# Patient Record
Sex: Female | Born: 1980 | Race: Black or African American | Hispanic: No | Marital: Single | Smoking: Current some day smoker
Health system: Southern US, Community
[De-identification: ages and names within clinical notes are randomized; demographics above are authoritative.]

## PROBLEM LIST (undated history)

## (undated) DIAGNOSIS — F32A Depression, unspecified: Secondary | ICD-10-CM

## (undated) DIAGNOSIS — F1220 Cannabis dependence, uncomplicated: Secondary | ICD-10-CM

## (undated) DIAGNOSIS — F909 Attention-deficit hyperactivity disorder, unspecified type: Secondary | ICD-10-CM

## (undated) DIAGNOSIS — F1321 Sedative, hypnotic or anxiolytic dependence, in remission: Secondary | ICD-10-CM

## (undated) DIAGNOSIS — N189 Chronic kidney disease, unspecified: Secondary | ICD-10-CM

## (undated) DIAGNOSIS — R0602 Shortness of breath: Secondary | ICD-10-CM

## (undated) DIAGNOSIS — E669 Obesity, unspecified: Secondary | ICD-10-CM

## (undated) DIAGNOSIS — E119 Type 2 diabetes mellitus without complications: Secondary | ICD-10-CM

## (undated) DIAGNOSIS — Z813 Family history of other psychoactive substance abuse and dependence: Secondary | ICD-10-CM

## (undated) DIAGNOSIS — F329 Major depressive disorder, single episode, unspecified: Secondary | ICD-10-CM

## (undated) DIAGNOSIS — F419 Anxiety disorder, unspecified: Secondary | ICD-10-CM

## (undated) DIAGNOSIS — A609 Anogenital herpesviral infection, unspecified: Secondary | ICD-10-CM

## (undated) DIAGNOSIS — I10 Essential (primary) hypertension: Secondary | ICD-10-CM

## (undated) DIAGNOSIS — F3181 Bipolar II disorder: Secondary | ICD-10-CM

## (undated) HISTORY — DX: Cannabis dependence, uncomplicated: F12.20

## (undated) HISTORY — DX: Depression, unspecified: F32.A

## (undated) HISTORY — DX: Family history of other psychoactive substance abuse and dependence: Z81.3

## (undated) HISTORY — DX: Major depressive disorder, single episode, unspecified: F32.9

## (undated) HISTORY — DX: Bipolar II disorder: F31.81

## (undated) HISTORY — DX: Attention-deficit hyperactivity disorder, unspecified type: F90.9

## (undated) HISTORY — DX: Anxiety disorder, unspecified: F41.9

## (undated) HISTORY — DX: Anogenital herpesviral infection, unspecified: A60.9

## (undated) HISTORY — DX: Morbid (severe) obesity due to excess calories: E66.01

## (undated) HISTORY — DX: Essential (primary) hypertension: I10

## (undated) HISTORY — DX: Sedative, hypnotic or anxiolytic dependence, in remission: F13.21

---
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
## 2000-03-09 ENCOUNTER — Encounter: Payer: Self-pay | Admitting: Emergency Medicine

## 2000-03-09 ENCOUNTER — Emergency Department (HOSPITAL_COMMUNITY): Admission: EM | Admit: 2000-03-09 | Discharge: 2000-03-09 | Payer: Self-pay | Admitting: Emergency Medicine

## 2000-08-16 ENCOUNTER — Emergency Department (HOSPITAL_COMMUNITY): Admission: EM | Admit: 2000-08-16 | Discharge: 2000-08-16 | Payer: Self-pay | Admitting: Emergency Medicine

## 2000-10-17 ENCOUNTER — Encounter: Payer: Self-pay | Admitting: *Deleted

## 2000-10-17 ENCOUNTER — Emergency Department (HOSPITAL_COMMUNITY): Admission: EM | Admit: 2000-10-17 | Discharge: 2000-10-17 | Payer: Self-pay | Admitting: Emergency Medicine

## 2000-10-30 ENCOUNTER — Emergency Department (HOSPITAL_COMMUNITY): Admission: EM | Admit: 2000-10-30 | Discharge: 2000-10-30 | Payer: Self-pay | Admitting: Emergency Medicine

## 2001-01-26 ENCOUNTER — Emergency Department (HOSPITAL_COMMUNITY): Admission: EM | Admit: 2001-01-26 | Discharge: 2001-01-26 | Payer: Self-pay | Admitting: Emergency Medicine

## 2001-11-15 ENCOUNTER — Inpatient Hospital Stay (HOSPITAL_COMMUNITY): Admission: AD | Admit: 2001-11-15 | Discharge: 2001-11-15 | Payer: Self-pay | Admitting: Obstetrics and Gynecology

## 2004-12-02 ENCOUNTER — Emergency Department (HOSPITAL_COMMUNITY): Admission: EM | Admit: 2004-12-02 | Discharge: 2004-12-02 | Payer: Self-pay | Admitting: Emergency Medicine

## 2005-04-05 ENCOUNTER — Emergency Department (HOSPITAL_COMMUNITY): Admission: EM | Admit: 2005-04-05 | Discharge: 2005-04-05 | Payer: Self-pay | Admitting: *Deleted

## 2005-10-05 ENCOUNTER — Other Ambulatory Visit: Admission: RE | Admit: 2005-10-05 | Discharge: 2005-10-05 | Payer: Self-pay | Admitting: Gynecology

## 2006-03-29 ENCOUNTER — Emergency Department (HOSPITAL_COMMUNITY): Admission: EM | Admit: 2006-03-29 | Discharge: 2006-03-30 | Payer: Self-pay | Admitting: Emergency Medicine

## 2006-04-26 ENCOUNTER — Emergency Department (HOSPITAL_COMMUNITY): Admission: EM | Admit: 2006-04-26 | Discharge: 2006-04-26 | Payer: Self-pay | Admitting: Emergency Medicine

## 2006-08-03 ENCOUNTER — Emergency Department (HOSPITAL_COMMUNITY): Admission: EM | Admit: 2006-08-03 | Discharge: 2006-08-03 | Payer: Self-pay | Admitting: Emergency Medicine

## 2010-04-25 ENCOUNTER — Emergency Department (HOSPITAL_BASED_OUTPATIENT_CLINIC_OR_DEPARTMENT_OTHER)
Admission: EM | Admit: 2010-04-25 | Discharge: 2010-04-25 | Payer: Self-pay | Source: Home / Self Care | Admitting: Emergency Medicine

## 2010-05-02 ENCOUNTER — Emergency Department (HOSPITAL_BASED_OUTPATIENT_CLINIC_OR_DEPARTMENT_OTHER)
Admission: EM | Admit: 2010-05-02 | Discharge: 2010-05-02 | Payer: Self-pay | Source: Home / Self Care | Admitting: Emergency Medicine

## 2010-07-20 LAB — CBC
HCT: 33.3 % — ABNORMAL LOW (ref 36.0–46.0)
Hemoglobin: 11.1 g/dL — ABNORMAL LOW (ref 12.0–15.0)
MCH: 27.5 pg (ref 26.0–34.0)
Platelets: 337 10*3/uL (ref 150–400)
RBC: 4.04 MIL/uL (ref 3.87–5.11)
RDW: 15 % (ref 11.5–15.5)
WBC: 4.1 10*3/uL (ref 4.0–10.5)

## 2010-07-20 LAB — BASIC METABOLIC PANEL
Calcium: 9 mg/dL (ref 8.4–10.5)
Creatinine, Ser: 0.7 mg/dL (ref 0.4–1.2)
GFR calc Af Amer: >60 mL/min (ref >60)
GFR calc non Af Amer: >60 mL/min (ref >60)

## 2010-07-20 LAB — URINALYSIS, ROUTINE W REFLEX MICROSCOPIC
Bilirubin Urine: NEGATIVE
Glucose, UA: NEGATIVE mg/dL
Hgb urine dipstick: NEGATIVE
Ketones, ur: NEGATIVE mg/dL
pH: 7 (ref 5.0–8.0)

## 2010-07-20 LAB — DIFFERENTIAL
Monocytes Absolute: 0.5 10*3/uL (ref 0.1–1.0)
Neutro Abs: 2.3 10*3/uL (ref 1.7–7.7)

## 2010-07-20 LAB — PREGNANCY, URINE: Preg Test, Ur: NEGATIVE

## 2010-08-03 ENCOUNTER — Emergency Department (HOSPITAL_BASED_OUTPATIENT_CLINIC_OR_DEPARTMENT_OTHER)
Admission: EM | Admit: 2010-08-03 | Discharge: 2010-08-03 | Disposition: A | Discharge status: Home / Self Care | Payer: Managed Care, Other (non HMO) | Attending: Emergency Medicine | Admitting: Emergency Medicine

## 2010-08-03 ENCOUNTER — Emergency Department (INDEPENDENT_AMBULATORY_CARE_PROVIDER_SITE_OTHER): Payer: Managed Care, Other (non HMO)

## 2010-08-03 DIAGNOSIS — E119 Type 2 diabetes mellitus without complications: Secondary | ICD-10-CM | POA: Insufficient documentation

## 2010-08-03 DIAGNOSIS — Y93A3 Activity, aerobic and step exercise: Secondary | ICD-10-CM | POA: Insufficient documentation

## 2010-08-03 DIAGNOSIS — X58XXXA Exposure to other specified factors, initial encounter: Secondary | ICD-10-CM | POA: Insufficient documentation

## 2010-08-03 DIAGNOSIS — I10 Essential (primary) hypertension: Secondary | ICD-10-CM | POA: Insufficient documentation

## 2010-08-03 DIAGNOSIS — T148XXA Other injury of unspecified body region, initial encounter: Secondary | ICD-10-CM | POA: Insufficient documentation

## 2010-08-03 DIAGNOSIS — M25559 Pain in unspecified hip: Secondary | ICD-10-CM | POA: Insufficient documentation

## 2010-08-31 ENCOUNTER — Emergency Department (HOSPITAL_BASED_OUTPATIENT_CLINIC_OR_DEPARTMENT_OTHER)
Admission: EM | Admit: 2010-08-31 | Discharge: 2010-08-31 | Disposition: A | Discharge status: Home / Self Care | Payer: Managed Care, Other (non HMO) | Attending: Emergency Medicine | Admitting: Emergency Medicine

## 2010-08-31 ENCOUNTER — Emergency Department (INDEPENDENT_AMBULATORY_CARE_PROVIDER_SITE_OTHER): Payer: Managed Care, Other (non HMO)

## 2010-08-31 DIAGNOSIS — I10 Essential (primary) hypertension: Secondary | ICD-10-CM | POA: Insufficient documentation

## 2010-08-31 DIAGNOSIS — R079 Chest pain, unspecified: Secondary | ICD-10-CM

## 2010-08-31 DIAGNOSIS — R0602 Shortness of breath: Secondary | ICD-10-CM | POA: Insufficient documentation

## 2010-08-31 DIAGNOSIS — R110 Nausea: Secondary | ICD-10-CM

## 2010-08-31 DIAGNOSIS — R0789 Other chest pain: Secondary | ICD-10-CM | POA: Insufficient documentation

## 2010-08-31 DIAGNOSIS — E119 Type 2 diabetes mellitus without complications: Secondary | ICD-10-CM | POA: Insufficient documentation

## 2010-08-31 DIAGNOSIS — J45909 Unspecified asthma, uncomplicated: Secondary | ICD-10-CM | POA: Insufficient documentation

## 2010-08-31 LAB — POCT CARDIAC MARKERS: Troponin i, poc: <0.05 ng/mL (ref 0.00–0.09)

## 2010-08-31 LAB — CBC
HCT: 31.1 % — ABNORMAL LOW (ref 36.0–46.0)
Hemoglobin: 10.5 g/dL — ABNORMAL LOW (ref 12.0–15.0)
MCH: 28 pg (ref 26.0–34.0)
MCHC: 33.8 g/dL (ref 30.0–36.0)
Platelets: 275 10*3/uL (ref 150–400)
RBC: 3.75 MIL/uL — ABNORMAL LOW (ref 3.87–5.11)
WBC: 3.8 10*3/uL — ABNORMAL LOW (ref 4.0–10.5)

## 2010-08-31 LAB — BASIC METABOLIC PANEL
Calcium: 8.5 mg/dL (ref 8.4–10.5)
Sodium: 140 mEq/L (ref 135–145)

## 2010-08-31 LAB — PREGNANCY, URINE: Preg Test, Ur: NEGATIVE

## 2011-08-10 ENCOUNTER — Emergency Department (HOSPITAL_COMMUNITY): Payer: Managed Care, Other (non HMO)

## 2011-08-10 ENCOUNTER — Emergency Department (HOSPITAL_COMMUNITY)
Admission: EM | Admit: 2011-08-10 | Discharge: 2011-08-10 | Disposition: A | Discharge status: Home / Self Care | Payer: Managed Care, Other (non HMO) | Attending: Emergency Medicine | Admitting: Emergency Medicine

## 2011-08-10 ENCOUNTER — Encounter (HOSPITAL_COMMUNITY): Payer: Self-pay | Admitting: Emergency Medicine

## 2011-08-10 DIAGNOSIS — J029 Acute pharyngitis, unspecified: Secondary | ICD-10-CM | POA: Insufficient documentation

## 2011-08-10 DIAGNOSIS — H538 Other visual disturbances: Secondary | ICD-10-CM | POA: Insufficient documentation

## 2011-08-10 DIAGNOSIS — G43909 Migraine, unspecified, not intractable, without status migrainosus: Secondary | ICD-10-CM | POA: Insufficient documentation

## 2011-08-10 HISTORY — DX: Obesity, unspecified: E66.9

## 2011-08-10 LAB — RAPID STREP SCREEN (MED CTR MEBANE ONLY): Streptococcus, Group A Screen (Direct): NEGATIVE

## 2011-08-10 MED ORDER — DEXAMETHASONE SODIUM PHOSPHATE 10 MG/ML IJ SOLN
10.0000 mg | Freq: Once | INTRAMUSCULAR | Status: AC
  Administered 2011-08-10: 10 mg via INTRAMUSCULAR
  Filled 2011-08-10: qty 1

## 2011-08-10 MED ORDER — METOCLOPRAMIDE HCL 5 MG/ML IJ SOLN
10.0000 mg | Freq: Once | INTRAMUSCULAR | Status: AC
  Administered 2011-08-10: 10 mg via INTRAVENOUS
  Filled 2011-08-10: qty 2

## 2011-08-10 MED ORDER — DIPHENHYDRAMINE HCL 50 MG/ML IJ SOLN
25.0000 mg | Freq: Once | INTRAMUSCULAR | Status: AC
  Administered 2011-08-10: 08:00:00 via INTRAVENOUS
  Filled 2011-08-10: qty 1

## 2011-08-10 NOTE — ED Notes (Signed)
PT. REPORTS HEADACHE WITH SORE THROAT ONSET LAST Thursday, DENIES FEVER OR CHILLS , SLIGHT BLURRED VISION .

## 2011-08-10 NOTE — ED Notes (Signed)
Pt transported to and from Ct scanner on stretcher with tech and tolerated well.

## 2011-08-10 NOTE — Discharge Instructions (Signed)
Migraine Headache A migraine headache is an intense, throbbing pain on one or both sides of your head. The exact cause of a migraine headache is not always known. A migraine may be caused when nerves in the brain become irritated and release chemicals that cause swelling within blood vessels, causing pain. Many migraine sufferers have a family history of migraines. Before you get a migraine you may or may not get an aura. An aura is a group of symptoms that can predict the beginning of a migraine. An aura may include:  Visual changes such as:   Flashing lights.   Bright spots or zig-zag lines.   Tunnel vision.   Feelings of numbness.   Trouble talking.   Muscle weakness.  SYMPTOMS  Pain on one or both sides of your head.   Pain that is pulsating or throbbing in nature.   Pain that is severe enough to prevent daily activities.   Pain that is aggravated by any daily physical activity.   Nausea (feeling sick to your stomach), vomiting, or both.   Pain with exposure to bright lights, loud noises, or activity.   General sensitivity to bright lights or loud noises.  MIGRAINE TRIGGERS Examples of triggers of migraine headaches include:   Alcohol.   Smoking.   Stress.   It may be related to menses (female menstruation).   Aged cheeses.   Foods or drinks that contain nitrates, glutamate, aspartame, or tyramine.   Lack of sleep.   Chocolate.   Caffeine.   Hunger.   Medications such as nitroglycerine (used to treat chest pain), birth control pills, estrogen, and some blood pressure medications.  DIAGNOSIS  A migraine headache is often diagnosed based on:  Symptoms.   Physical examination.   A computerized X-ray scan (computed tomography, CT) of your head.  TREATMENT  Medications can help prevent migraines if they are recurrent or should they become recurrent. Your caregiver can help you with a medication or treatment program that will be helpful to you.   Lying  down in a dark, quiet room may be helpful.   Keeping a headache diary may help you find a trend as to what may be triggering your headaches.  SEEK IMMEDIATE MEDICAL CARE IF:   You have confusion, personality changes or seizures.   You have headaches that wake you from sleep.   You have an increased frequency in your headaches.   You have a stiff neck.   You have a loss of vision.   You have muscle weakness.   You start losing your balance or have trouble walking.   You feel faint or pass out.  MAKE SURE YOU:   Understand these instructions.   Will watch your condition.   Will get help right away if you are not doing well or get worse.  Document Released: 04/26/2005 Document Revised: 04/15/2011 Document Reviewed: 12/10/2008 [facility] Patient Information 2012 [place], [place].

## 2011-08-10 NOTE — ED Notes (Signed)
Pt presents with 2-3 day h/o headache, chills and sore throat.  Pt seen at OSH on Saturday, was + for strep, received IV fluids, "2 shots" and discharged.  Pt reports pain has continued, reports now she is having neck pain and vomiting.

## 2011-08-10 NOTE — ED Provider Notes (Signed)
History     CSN: [id number]  Arrival date & time 08/10/11  0532   First MD Initiated Contact with Patient 08/10/11 0725      Chief Complaint  Patient presents with  . Headache    (Consider location/radiation/quality/duration/timing/severity/associated sxs/prior treatment) Patient is a 31 y.o. female presenting with headaches. The history is provided by the patient.  Headache    patient presents with headache x4 days. Location of the headache is in the front portion of her face radiates down to once for neck. She has used Aleve without relief. Some photophobia but no fever or vomiting. Denies any meningismus. Some blurred vision but no visual loss. Patient also notes sore throat was seen in the hospital recently for same and had a positive strep test but was not treated with antibiotics  Past Medical History  Diagnosis Date  . Obesity     History reviewed. No pertinent past surgical history.  No family history on file.  History  Substance Use Topics  . Smoking status: Never Smoker   . Smokeless tobacco: Not on file  . Alcohol Use: No    OB History    Grav Para Term Preterm Abortions TAB SAB Ect Mult Living                  Review of Systems  Neurological: Positive for headaches.  All other systems reviewed and are negative.    Allergies  Penicillins  Home Medications  No current outpatient prescriptions on file.  BP 152/108  Pulse 74  Temp(Src) 99.4 F (37.4 C) (Oral)  Resp 19  SpO2 99%  LMP 08/05/2011  Physical Exam  Nursing note and vitals reviewed. Constitutional: She is oriented to person, place, and time. She appears well-developed and well-nourished.  Non-toxic appearance. No distress.  HENT:  Head: Normocephalic and atraumatic.  Eyes: Conjunctivae, EOM and lids are normal. Pupils are equal, round, and reactive to light.  Neck: Normal range of motion. Neck supple. No tracheal deviation present. No mass present.  Cardiovascular: Normal rate,  regular rhythm and normal heart sounds.  Exam reveals no gallop.   No murmur heard. Pulmonary/Chest: Effort normal and breath sounds normal. No stridor. No respiratory distress. She has no decreased breath sounds. She has no wheezes. She has no rhonchi. She has no rales.  Abdominal: Soft. Normal appearance and bowel sounds are normal. She exhibits no distension. There is no tenderness. There is no rebound and no CVA tenderness.  Musculoskeletal: Normal range of motion. She exhibits no edema and no tenderness.  Neurological: She is alert and oriented to person, place, and time. She has normal strength. No cranial nerve deficit or sensory deficit. GCS eye subscore is 4. GCS verbal subscore is 5. GCS motor subscore is 6.  Skin: Skin is warm and dry. No abrasion and no rash noted.  Psychiatric: She has a normal mood and affect. Her speech is normal and behavior is normal.    ED Course  Procedures (including critical care time)  Labs Reviewed - No data to display No results found.   No diagnosis found.    MDM  Pt given meds for her headache and feels better, head ct neg--no concern for [facility] or meningitis--repeat neuro exam at time of d/c stable, no nuchal regidity--will d/c        Toy Baker, MD 08/10/11 [phone]

## 2011-10-06 ENCOUNTER — Ambulatory Visit: Payer: Managed Care, Other (non HMO) | Admitting: *Deleted

## 2011-12-03 ENCOUNTER — Encounter (HOSPITAL_COMMUNITY): Payer: Self-pay | Admitting: *Deleted

## 2011-12-03 ENCOUNTER — Encounter (HOSPITAL_COMMUNITY): Payer: Self-pay | Admitting: Psychiatry

## 2011-12-03 ENCOUNTER — Ambulatory Visit (INDEPENDENT_AMBULATORY_CARE_PROVIDER_SITE_OTHER): Payer: Managed Care, Other (non HMO) | Admitting: Psychiatry

## 2011-12-03 ENCOUNTER — Ambulatory Visit (HOSPITAL_COMMUNITY)
Admission: RE | Admit: 2011-12-03 | Discharge: 2011-12-03 | Disposition: A | Discharge status: Home / Self Care | Payer: Managed Care, Other (non HMO) | Attending: Psychiatry | Admitting: Psychiatry

## 2011-12-03 VITALS — BP 140/90 | HR 80 | Wt 399.0 lb

## 2011-12-03 DIAGNOSIS — I10 Essential (primary) hypertension: Secondary | ICD-10-CM | POA: Insufficient documentation

## 2011-12-03 DIAGNOSIS — N189 Chronic kidney disease, unspecified: Secondary | ICD-10-CM

## 2011-12-03 DIAGNOSIS — F39 Unspecified mood [affective] disorder: Secondary | ICD-10-CM

## 2011-12-03 DIAGNOSIS — F329 Major depressive disorder, single episode, unspecified: Secondary | ICD-10-CM | POA: Insufficient documentation

## 2011-12-03 HISTORY — DX: Chronic kidney disease, unspecified: N18.9

## 2011-12-03 MED ORDER — ESCITALOPRAM OXALATE 10 MG PO TABS: ORAL_TABLET | ORAL | Status: DC
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
## 2011-12-06 ENCOUNTER — Encounter (HOSPITAL_COMMUNITY): Payer: Self-pay

## 2011-12-06 ENCOUNTER — Other Ambulatory Visit (HOSPITAL_COMMUNITY): Payer: Self-pay | Attending: Psychiatry | Admitting: Psychiatry

## 2011-12-06 DIAGNOSIS — F332 Major depressive disorder, recurrent severe without psychotic features: Secondary | ICD-10-CM | POA: Insufficient documentation

## 2011-12-06 DIAGNOSIS — I129 Hypertensive chronic kidney disease with stage 1 through stage 4 chronic kidney disease, or unspecified chronic kidney disease: Secondary | ICD-10-CM | POA: Insufficient documentation

## 2011-12-06 DIAGNOSIS — F909 Attention-deficit hyperactivity disorder, unspecified type: Secondary | ICD-10-CM

## 2011-12-06 DIAGNOSIS — F331 Major depressive disorder, recurrent, moderate: Secondary | ICD-10-CM

## 2011-12-06 DIAGNOSIS — N189 Chronic kidney disease, unspecified: Secondary | ICD-10-CM | POA: Insufficient documentation

## 2011-12-06 DIAGNOSIS — E669 Obesity, unspecified: Secondary | ICD-10-CM | POA: Insufficient documentation

## 2011-12-06 DIAGNOSIS — F988 Other specified behavioral and emotional disorders with onset usually occurring in childhood and adolescence: Secondary | ICD-10-CM | POA: Insufficient documentation

## 2011-12-06 DIAGNOSIS — F411 Generalized anxiety disorder: Secondary | ICD-10-CM | POA: Insufficient documentation
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
## 2011-12-07 ENCOUNTER — Other Ambulatory Visit (HOSPITAL_COMMUNITY): Payer: Self-pay

## 2011-12-07 MED ORDER — CITALOPRAM HYDROBROMIDE 20 MG PO TABS: 20.0000 mg | ORAL_TABLET | Freq: Every day | ORAL | Status: DC

## 2011-12-07 NOTE — Progress Notes (Signed)
    Daily Group Progress Note  Program: IOP  Group Time: 9:00-10:30 am   Participation Level: Active  Behavioral Response: Appropriate  Type of Therapy:  Process Group  Summary of Progress: Patient appeared agitated and resistant at the start of the group, but after she shared she reported "accepting" that she needs help and presented as more open to the group therapy experience. She shared how she put her life on hold to take care of her family and how she is grieving the career and life she gave up when she took on the role as caretaker. She described having anger at what she lost and struggled to see how her role of caretaker is causing dissatisfaction in her life.      Group Time: 10:30 am - 12:00 pm   Participation Level:  Active  Behavioral Response: Appropriate  Type of Therapy: Psycho-education Group  Summary of Progress: Patient participated in a discussion on boundaries and identified barriers to setting healthy boundaries with others due to different personality styles that interfere with limit setting.   Maxcine Ham, MSW, LCSW

## 2011-12-08 ENCOUNTER — Telehealth (HOSPITAL_COMMUNITY): Payer: Self-pay | Admitting: Psychiatry

## 2011-12-08 ENCOUNTER — Other Ambulatory Visit (HOSPITAL_COMMUNITY): Payer: Self-pay

## 2011-12-08 ENCOUNTER — Other Ambulatory Visit (HOSPITAL_COMMUNITY): Payer: Self-pay | Admitting: *Deleted

## 2011-12-08 DIAGNOSIS — F39 Unspecified mood [affective] disorder: Secondary | ICD-10-CM

## 2011-12-08 MED ORDER — CITALOPRAM HYDROBROMIDE 20 MG PO TABS: 20.0000 mg | ORAL_TABLET | Freq: Every day | ORAL | Status: DC

## 2011-12-08 NOTE — Progress Notes (Signed)
Patient ID: Alexis Mccullough, female   DOB: May 16, 1980, 31 y.o.   MRN: [id number] D:  Returned call to [facility] Victorino Dike:  [phone]).  Answered all her questions (ie. Pt's admit date, dx, program hrs, and estimated LOS in [facility]).

## 2011-12-08 NOTE — Telephone Encounter (Signed)
RX for Celexa printed in office, written by Dr.Tadepalli,  sent to [facility] at pt request

## 2011-12-08 NOTE — Progress Notes (Signed)
    Daily Group Progress Note  Program: IOP  Group Time:   Participation Level: Active  Behavioral Response: Appropriate  Type of Therapy:  Process Group  Summary of Progress: Patient is very active and engaged in her wellness. She reports a decrease in her depression and an awareness of how she has put her needs last and how this has caused her depression. She described feeling unhappy with her weight and how it is causing physical problems. She wants to begin losing weight and focus on herself. She was challenged to slow down and explore more of what has caused her to gain the weight as she appears to want to rush to the solution before understanding the cause. She is very open and receptive to change.      Group Time: 10:30 am - 12:00 pm   Participation Level:  Active  Behavioral Response: Appropriate  Type of Therapy: Psycho-education Group  Summary of Progress: Patient participated in part 2 of the boundary discussion from yesterday and learned the five ways to set healthy limits with others.    Maxcine Ham, MSW, LCSW

## 2011-12-09 ENCOUNTER — Other Ambulatory Visit (HOSPITAL_COMMUNITY): Payer: Self-pay | Admitting: *Deleted

## 2011-12-09 ENCOUNTER — Other Ambulatory Visit (HOSPITAL_COMMUNITY): Payer: Self-pay | Attending: Psychiatry

## 2011-12-09 DIAGNOSIS — I129 Hypertensive chronic kidney disease with stage 1 through stage 4 chronic kidney disease, or unspecified chronic kidney disease: Secondary | ICD-10-CM | POA: Insufficient documentation

## 2011-12-09 DIAGNOSIS — F332 Major depressive disorder, recurrent severe without psychotic features: Secondary | ICD-10-CM | POA: Insufficient documentation

## 2011-12-09 DIAGNOSIS — F411 Generalized anxiety disorder: Secondary | ICD-10-CM | POA: Insufficient documentation

## 2011-12-09 DIAGNOSIS — F1210 Cannabis abuse, uncomplicated: Secondary | ICD-10-CM | POA: Insufficient documentation

## 2011-12-09 DIAGNOSIS — F39 Unspecified mood [affective] disorder: Secondary | ICD-10-CM

## 2011-12-09 DIAGNOSIS — N189 Chronic kidney disease, unspecified: Secondary | ICD-10-CM | POA: Insufficient documentation

## 2011-12-09 DIAGNOSIS — E669 Obesity, unspecified: Secondary | ICD-10-CM | POA: Insufficient documentation

## 2011-12-09 DIAGNOSIS — F988 Other specified behavioral and emotional disorders with onset usually occurring in childhood and adolescence: Secondary | ICD-10-CM | POA: Insufficient documentation

## 2011-12-09 MED ORDER — CITALOPRAM HYDROBROMIDE 20 MG PO TABS: 20.0000 mg | ORAL_TABLET | Freq: Every day | ORAL | Status: DC

## 2011-12-09 NOTE — Progress Notes (Signed)
    Daily Group Progress Note  Program: IOP  Group Time: 9:00-10:30 am   Participation Level: Active  Behavioral Response: Appropriate  Type of Therapy:  Process Group  Summary of Progress: Patient reports stress today associated with learning her insurance coverage has lapsed. She took ownership over this and states it was the result of her not paying closer attention to her insurance status. She related this to another example of how she puts her needs last and others before hers. She is identifying how neglecting herself is causing negative consequences in her own life.      Group Time: 10:30 am - 12:00 pm   Participation Level:  Active  Behavioral Response: Appropriate  Type of Therapy: Grief and Loss  Summary of Progress: Patient participated in a group on grief and loss by Theda Belfast and identified personal losses and ways to grieve.   Maxcine Ham, MSW, LCSW

## 2011-12-09 NOTE — Telephone Encounter (Signed)
Celexa sent thru [facility] on 7/31 did not arrive at pharmacy. Called directly to pharmacy at 12:48.

## 2011-12-10 ENCOUNTER — Telehealth (HOSPITAL_COMMUNITY): Payer: Self-pay | Admitting: Psychiatry

## 2011-12-10 ENCOUNTER — Other Ambulatory Visit (HOSPITAL_COMMUNITY): Payer: Self-pay

## 2011-12-13 ENCOUNTER — Other Ambulatory Visit (HOSPITAL_COMMUNITY): Payer: Self-pay

## 2011-12-13 NOTE — Progress Notes (Signed)
Patient ID: Alexis Mccullough, female   DOB: 1980-11-02, 31 y.o.   MRN: [id number] Pt seen with Jeri Modena , states has some acidity at night , discussed taking her meds earlier, with a glass of milk. Sleep-good, app-good, mood-better, no si/hi, no hallu./ delusions.

## 2011-12-13 NOTE — Progress Notes (Signed)
    Daily Group Progress Note  Program: IOP  Group Time: 9:00-10:30 am   Participation Level: Active  Behavioral Response: Appropriate  Type of Therapy:  Process Group  Summary of Progress: Patient reports feeling good today. She was wearing dress clothes and had her hair styled. She states she is looking forward to an upcoming job interview and appeared hopeful about the future. She is learning to put herself first and set boundaries with people.      Group Time: 10:30 am - 12:00 pm    Participation Level:  Active  Behavioral Response: Appropriate  Type of Therapy: Psycho-education Group  Summary of Progress: Patient participated in a discussion on anger an identified its purpose and how to manage it affectively.   Maxcine Ham, MSW, LCSW

## 2011-12-14 ENCOUNTER — Encounter (HOSPITAL_COMMUNITY): Payer: Self-pay

## 2011-12-14 ENCOUNTER — Other Ambulatory Visit (HOSPITAL_COMMUNITY): Payer: Self-pay
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
## 2011-12-14 NOTE — Progress Notes (Signed)
Patient ID: Alexis Mccullough, female   DOB: 10-18-1980, 31 y.o.   MRN: [id number] D:  Placed call to [employer] ([phone]) re: pt's denied claim.  A:  Left vm for Metro Kung (case manager) re: pt's denied claim.  Requested she call writer back as soon as possible.  Informed pt.  R:  Pt receptive.

## 2011-12-15 ENCOUNTER — Other Ambulatory Visit (HOSPITAL_COMMUNITY): Payer: Self-pay | Admitting: Psychiatry

## 2011-12-15 ENCOUNTER — Other Ambulatory Visit (HOSPITAL_COMMUNITY): Payer: Self-pay

## 2011-12-15 NOTE — Progress Notes (Signed)
    Daily Group Progress Note  Program: IOP  Group Time: 9:00-10:30 am   Participation Level: Active  Behavioral Response: Appropriate  Type of Therapy:  Process Group  Summary of Progress: Patient expressed stress over the possibility that her short term disability claim could be denied. She stepped out of the group room to contact her insurance to try to resolve the issue.      Group Time: 10:30 am - 12:00 pm   Participation Level:  Active  Behavioral Response: Appropriate  Type of Therapy: Psycho-education Group  Summary of Progress: Patient participated in a discussion and education segment on self-esteem and identified how it is created and ways to improve it.   Maxcine Ham, MSW, LCSW

## 2011-12-16 ENCOUNTER — Other Ambulatory Visit (HOSPITAL_COMMUNITY): Payer: Self-pay

## 2011-12-17 ENCOUNTER — Other Ambulatory Visit (HOSPITAL_COMMUNITY): Payer: Self-pay

## 2011-12-20 ENCOUNTER — Other Ambulatory Visit (HOSPITAL_COMMUNITY): Payer: Self-pay

## 2011-12-21 ENCOUNTER — Other Ambulatory Visit (HOSPITAL_COMMUNITY): Payer: Self-pay

## 2011-12-21 NOTE — Progress Notes (Signed)
Patient ID: Alexis Mccullough, female   DOB: 14-Jun-1980, 31 y.o.   MRN: [id number] D:  This is a 68 single african [place] female, who was referred per Dr. Lolly Mustache, treatment for depressive and anxiety symptoms with SI.  This Clinical research associate last saw pt on 12-14-11, before writer's vacation.  On that day, pt denied any SI/HI or A/V hallucinations.  Pt had called writer on 12-14-11 after group stating that [employer] had denied her claim.  Apparently, pt never returned to [facility] nor did she call.  A:  D/C pt today due to absences.  Placed call to pt, she stated that she was stuck in [place], [place] over the weekend.  Will follow up with Cleophas Dunker, LMFT on 12-22-11 at 10 a.m and Dr Lolly Mustache on 12-29-11 @ 3:30 pm.  Encouraged support groups.  RTW on 12-30-11.  R:  Pt receptive.

## 2011-12-21 NOTE — Patient Instructions (Addendum)
Patient will follow up with Cleophas Dunker, LMFT on 12-22-11 @ 10 a.m and Dr. Lolly Mustache on 12-29-11 @ 3:30pm.  Encouraged support groups.  RTW on 12-30-11.

## 2011-12-21 NOTE — Progress Notes (Signed)
  [facility] Intensive Outpatient Program Discharge Summary  Alexis Mccullough [id number]  Admission date 7: -29-13 Discharge date: 12-21-11  Reason for admission: Depression and anxiety with panic attacks.  Chemical Use History: Patient uses 3 joints of marijuana per week states it helps her.  Family of Origin Issues: None  Progress in Program Toward Treatment Goals: Patient started IOP and because of her depression was started on Celexa 20 mg every day. She tolerated the medications well and was continued on her Xanax 1 mg when necessary and lisinopril hydrochlorothiazide 1 tablet every day. Patient began talking in groups and expressing herself A. and began feeling better. Her sleep and appetite were good mood stabilized and she had no suicidal or homicidal ideation. No hallucinations or delusions were noted. Patient short-term disability was denied and so patient decided to quit IOP and did not show up for 3 days and so was discharged. Labs- TSH and T4 were normal.  Discharge diagnoses ;  Axis I Maj. depression recurrent.           Anxiety disorder NOS.          ADHD inattentive type. Axis II deferred. Axis III obesity, hypertension. Axis IV problems with the primary support group and social environment and economic problems. Axis V GAF 70  Discharge recommendations #1 activity as tolerated. #2 diet regular. #3 patient will continue Celexa 20 mg every day. #4 patient will followup with Dr. Lolly Mustache for medications and Cleophas Dunker for therapy   Margit Banda [facility] Psych 12/21/2011

## 2011-12-22 ENCOUNTER — Ambulatory Visit (HOSPITAL_COMMUNITY): Payer: Self-pay | Admitting: Marriage and Family Therapist

## 2011-12-22 ENCOUNTER — Other Ambulatory Visit (HOSPITAL_COMMUNITY): Payer: Self-pay

## 2011-12-22 ENCOUNTER — Telehealth (HOSPITAL_COMMUNITY): Payer: Self-pay

## 2011-12-22 NOTE — Telephone Encounter (Signed)
10:158am 12/22/11 s/w with pt informed that provider - Dorina Hoyer will not see her and has referred her to [facility] [phone] - also advised pt that future appts with Dorina Hoyer has been cancelled - gave pt the contact information before the call ended. Marguerite Olea

## 2011-12-23 ENCOUNTER — Other Ambulatory Visit (HOSPITAL_COMMUNITY): Payer: Self-pay

## 2011-12-24 ENCOUNTER — Other Ambulatory Visit (HOSPITAL_COMMUNITY): Payer: Self-pay

## 2011-12-24 ENCOUNTER — Emergency Department (HOSPITAL_BASED_OUTPATIENT_CLINIC_OR_DEPARTMENT_OTHER): Payer: Managed Care, Other (non HMO)

## 2011-12-24 ENCOUNTER — Encounter (HOSPITAL_BASED_OUTPATIENT_CLINIC_OR_DEPARTMENT_OTHER): Payer: Self-pay | Admitting: *Deleted

## 2011-12-24 ENCOUNTER — Emergency Department (HOSPITAL_BASED_OUTPATIENT_CLINIC_OR_DEPARTMENT_OTHER)
Admission: EM | Admit: 2011-12-24 | Discharge: 2011-12-25 | Disposition: A | Discharge status: Home / Self Care | Payer: Managed Care, Other (non HMO) | Attending: Emergency Medicine | Admitting: Emergency Medicine

## 2011-12-24 DIAGNOSIS — R109 Unspecified abdominal pain: Secondary | ICD-10-CM | POA: Insufficient documentation

## 2011-12-24 DIAGNOSIS — K8020 Calculus of gallbladder without cholecystitis without obstruction: Secondary | ICD-10-CM

## 2011-12-24 DIAGNOSIS — R110 Nausea: Secondary | ICD-10-CM | POA: Insufficient documentation

## 2011-12-24 DIAGNOSIS — I10 Essential (primary) hypertension: Secondary | ICD-10-CM | POA: Insufficient documentation

## 2011-12-24 DIAGNOSIS — R079 Chest pain, unspecified: Secondary | ICD-10-CM | POA: Insufficient documentation

## 2011-12-24 LAB — CBC WITH DIFFERENTIAL/PLATELET
Basophils Absolute: 0 10*3/uL (ref 0.0–0.1)
Basophils Relative: 0 % (ref 0–1)
Eosinophils Absolute: 0 10*3/uL (ref 0.0–0.7)
Hemoglobin: 10.9 g/dL — ABNORMAL LOW (ref 12.0–15.0)
MCH: 28.9 pg (ref 26.0–34.0)
MCHC: 33.7 g/dL (ref 30.0–36.0)
Monocytes Relative: 11 % (ref 3–12)
Neutro Abs: 3.4 10*3/uL (ref 1.7–7.7)
Neutrophils Relative %: 59 % (ref 43–77)
Platelets: 283 10*3/uL (ref 150–400)
RDW: 13.8 % (ref 11.5–15.5)

## 2011-12-24 LAB — COMPREHENSIVE METABOLIC PANEL
ALT: 10 U/L (ref 0–35)
Calcium: 9.5 mg/dL (ref 8.4–10.5)
Chloride: 96 mEq/L (ref 96–112)
Creatinine, Ser: 0.7 mg/dL (ref 0.50–1.10)
GFR calc Af Amer: >90 mL/min (ref >90)
GFR calc non Af Amer: >90 mL/min (ref >90)
Glucose, Bld: 121 mg/dL — ABNORMAL HIGH (ref 70–99)
Potassium: 3.4 mEq/L — ABNORMAL LOW (ref 3.5–5.1)
Sodium: 137 mEq/L (ref 135–145)

## 2011-12-24 MED ORDER — HYDROCODONE-ACETAMINOPHEN 5-500 MG PO TABS
1.0000 | ORAL_TABLET | Freq: Four times a day (QID) | ORAL | Status: DC | PRN
Start: 2011-12-24 — End: 2011-12-29

## 2011-12-24 MED ORDER — HYDROCODONE-ACETAMINOPHEN 5-325 MG PO TABS
2.0000 | ORAL_TABLET | Freq: Once | ORAL | Status: AC
  Administered 2011-12-24: 2 via ORAL
  Filled 2011-12-24: qty 2

## 2011-12-24 NOTE — ED Notes (Signed)
Chest, neck, back and abdominal pain x 4 days. States she has not had a bowel movement x 1 week. No relief with laxatives and gas relievers.

## 2011-12-24 NOTE — ED Provider Notes (Signed)
History     CSN: [id number]  Arrival date & time 12/24/11  2102   First MD Initiated Contact with Patient 12/24/11 2140      Chief Complaint  Patient presents with  . Chest Pain    (Consider location/radiation/quality/duration/timing/severity/associated sxs/prior treatment) HPI Comments: Patient with 4 day history of epigastric and chest discomfort.  There is no shortness of breath or cough.  It is worse with food.  No exertional complaints.    Patient is a 31 y.o. female presenting with chest pain. The history is provided by the patient.  Chest Pain Episode onset: 4 days ago. Chest pain occurs intermittently. The chest pain is worsening. The pain is associated with breathing. The severity of the pain is moderate. The pain radiates to the epigastrium. Chest pain is worsened by certain positions and eating. Primary symptoms include nausea. Pertinent negatives for primary symptoms include no fever, no cough and no vomiting. She tried nothing for the symptoms. Risk factors include no known risk factors.     Past Medical History  Diagnosis Date  . Obesity   . HTN (hypertension)   . Obesity   . Depression   . Chronic kidney disease 12/03/2011    Hx of elevated protein in urine.  . Anxiety     History reviewed. No pertinent past surgical history.  Family History  Problem Relation Age of Onset  . Bipolar disorder Mother   . Anxiety disorder Sister     History  Substance Use Topics  . Smoking status: Never Smoker   . Smokeless tobacco: Never Used  . Alcohol Use: 0.0 oz/week    4-5 Glasses of wine per week    OB History    Grav Para Term Preterm Abortions TAB SAB Ect Mult Living                  Review of Systems  Constitutional: Negative for fever.  Respiratory: Negative for cough.   Cardiovascular: Positive for chest pain.  Gastrointestinal: Positive for nausea. Negative for vomiting.  All other systems reviewed and are negative.    Allergies  Amoxicillin and  Penicillins  Home Medications   Current Outpatient Rx  Name Route Sig Dispense Refill  . ALPRAZOLAM 0.25 MG PO TABS Oral Take 0.5 mg by mouth 3 (three) times daily as needed. For anxiety.    Marland Kitchen CITALOPRAM HYDROBROMIDE 20 MG PO TABS Oral Take 20 mg by mouth daily.    Marland Kitchen LISINOPRIL-HYDROCHLOROTHIAZIDE 10-12.5 MG PO TABS Oral Take 1 tablet by mouth 2 (two) times daily.    Marland Kitchen PHAZYME PO Oral Take 2 tablets by mouth daily as needed. For flatulence.      BP 145/84  Pulse 71  Temp 97.4 F (36.3 C) (Oral)  Resp 18  SpO2 100%  Physical Exam  Nursing note and vitals reviewed. Constitutional: She is oriented to person, place, and time. She appears well-developed and well-nourished. No distress.  HENT:  Head: Normocephalic and atraumatic.  Neck: Normal range of motion. Neck supple.  Cardiovascular: Normal rate and regular rhythm.  Exam reveals no gallop and no friction rub.   No murmur heard. Pulmonary/Chest: Effort normal and breath sounds normal. No respiratory distress. She has no wheezes.  Abdominal: Soft. Bowel sounds are normal. She exhibits no distension. There is no tenderness.  Musculoskeletal: Normal range of motion.  Neurological: She is alert and oriented to person, place, and time.  Skin: Skin is warm and dry. She is not diaphoretic.    ED  Course  Procedures (including critical care time)  Labs Reviewed  CBC WITH DIFFERENTIAL - Abnormal; Notable for the following:    RBC 3.77 (*)     Hemoglobin 10.9 (*)     HCT 32.3 (*)     All other components within normal limits  COMPREHENSIVE METABOLIC PANEL - Abnormal; Notable for the following:    Potassium 3.4 (*)     Glucose, Bld 121 (*)     Total Bilirubin 0.1 (*)     All other components within normal limits  LIPASE, BLOOD  TROPONIN I   No results found.   No diagnosis found.   Date: 12/24/2011  Rate: 67  Rhythm: normal sinus rhythm  QRS Axis: normal  Intervals: normal  ST/T Wave abnormalities: normal  Conduction  Disutrbances:none  Narrative Interpretation:   Old EKG Reviewed: unchanged    MDM  The patient presents with epigastric abd pain into the chest.  Workup does not reveal a cardiac etiology, but does show gallstones.  There is no elevation of wbc or liver enzymes and the patient is afebrile.  She will be given medications for pain, and the number for [place] to arrange follow up.  She is to return prn should she develop fever, vomiting, or increasing pain.        Geoffery Lyons, MD 12/24/11 [phone]

## 2011-12-27 ENCOUNTER — Other Ambulatory Visit (HOSPITAL_COMMUNITY): Payer: Self-pay

## 2011-12-28 ENCOUNTER — Ambulatory Visit (HOSPITAL_COMMUNITY): Payer: Self-pay | Admitting: Psychiatry

## 2011-12-29 ENCOUNTER — Ambulatory Visit (INDEPENDENT_AMBULATORY_CARE_PROVIDER_SITE_OTHER): Payer: Managed Care, Other (non HMO) | Admitting: Surgery

## 2011-12-29 ENCOUNTER — Encounter (HOSPITAL_COMMUNITY): Payer: Self-pay | Admitting: Psychiatry

## 2011-12-29 ENCOUNTER — Ambulatory Visit (INDEPENDENT_AMBULATORY_CARE_PROVIDER_SITE_OTHER): Payer: Self-pay | Admitting: Psychiatry

## 2011-12-29 ENCOUNTER — Encounter (HOSPITAL_COMMUNITY): Payer: Self-pay | Admitting: Pharmacy Technician

## 2011-12-29 ENCOUNTER — Encounter (INDEPENDENT_AMBULATORY_CARE_PROVIDER_SITE_OTHER): Payer: Self-pay | Admitting: Surgery

## 2011-12-29 VITALS — BP 160/84 | HR 84 | Wt 385.2 lb

## 2011-12-29 VITALS — BP 160/84 | HR 72 | Temp 97.0°F | Resp 16 | Ht 65.0 in | Wt 386.2 lb

## 2011-12-29 DIAGNOSIS — F1210 Cannabis abuse, uncomplicated: Secondary | ICD-10-CM

## 2011-12-29 DIAGNOSIS — F39 Unspecified mood [affective] disorder: Secondary | ICD-10-CM

## 2011-12-29 DIAGNOSIS — F329 Major depressive disorder, single episode, unspecified: Secondary | ICD-10-CM

## 2011-12-29 DIAGNOSIS — K8020 Calculus of gallbladder without cholecystitis without obstruction: Secondary | ICD-10-CM

## 2011-12-29 DIAGNOSIS — K8050 Calculus of bile duct without cholangitis or cholecystitis without obstruction: Secondary | ICD-10-CM

## 2011-12-29 MED ORDER — POLYETHYLENE GLYCOL 3350 17 GM/SCOOP PO POWD: 17.0000 g | Freq: Every day | ORAL | Status: DC

## 2011-12-29 MED ORDER — CITALOPRAM HYDROBROMIDE 10 MG PO TABS: ORAL_TABLET | ORAL | Status: DC

## 2011-12-29 MED ORDER — HYDROCODONE-ACETAMINOPHEN 5-500 MG PO TABS: 1.0000 | ORAL_TABLET | Freq: Four times a day (QID) | ORAL | Status: DC | PRN

## 2011-12-29 MED ORDER — ALPRAZOLAM 0.25 MG PO TABS: 0.2500 mg | ORAL_TABLET | ORAL | Status: DC | PRN

## 2011-12-29 MED ORDER — ALPRAZOLAM 0.25 MG PO TABS: 0.5000 mg | ORAL_TABLET | ORAL | Status: DC | PRN

## 2011-12-29 NOTE — Patient Instructions (Signed)
Laparoscopic Cholecystectomy Care After These instructions give you information on caring for yourself after your procedure. Your doctor may also give you more specific instructions. Call your doctor if you have any problems or questions after your procedure. HOME CARE  Change your bandages (dressings) as told by your doctor.   Keep the wound dry and clean. Wash the wound gently with soap and water. Pat the wound dry with a clean towel.   Do not take baths, swim, or use hot tubs for 10 days, or as told by your doctor.   Only take medicine as told by your doctor.   Eat a normal diet as told by your doctor.   Do not lift anything heavier than 25 pounds (11.5 kg), or as told by your doctor.   Do not play contact sports for 1 week, or as told by your doctor.  GET HELP RIGHT AWAY IF:   Your wound is red, puffy (swollen), or painful.   You have yellowish-white fluid (pus) coming from the wound.   You have fluid draining from the wound for more than 1 day.   You have a bad smell coming from the wound.   Your wound breaks open.   You have a rash.   You have trouble breathing.   You have chest pain.   You have a bad reaction to your medicine.   You have a fever.   You have pain in the shoulders (shoulder strap areas).   You feel dizzy or pass out (faint).   You have severe belly (abdominal) pain.   You feel sick to your stomach (nauseous) or throw up (vomit) for more than 1 day.  MAKE SURE YOU:  Understand these instructions.   Will watch your condition.   Will get help right away if you are not doing well or get worse.  Document Released: 02/03/2008 Document Revised: 04/15/2011 Document Reviewed: 10/13/2010 [facility] Patient Information 2012 [place], [place].Laparoscopic Cholecystectomy Laparoscopic cholecystectomy is surgery to remove the gallbladder. The gallbladder is located slightly to the right of center in the abdomen, behind the liver. It is a concentrating and  storage sac for the bile produced in the liver. Bile aids in the digestion and absorption of fats. Gallbladder disease (cholecystitis) is an inflammation of your gallbladder. This condition is usually caused by a buildup of gallstones (cholelithiasis) in your gallbladder. Gallstones can block the flow of bile, resulting in inflammation and pain. In severe cases, emergency surgery may be required. When emergency surgery is not required, you will have time to prepare for the procedure. Laparoscopic surgery is an alternative to open surgery. Laparoscopic surgery usually has a shorter recovery time. Your common bile duct may also need to be examined and explored. Your caregiver will discuss this with you if he or she feels this should be done. If stones are found in the common bile duct, they may be removed. LET YOUR CAREGIVER KNOW ABOUT:  Allergies to food or medicine.   Medicines taken, including vitamins, herbs, eyedrops, over-the-counter medicines, and creams.   Use of steroids (by mouth or creams).   Previous problems with anesthetics or numbing medicines.   History of bleeding problems or blood clots.   Previous surgery.   Other health problems, including diabetes and kidney problems.   Possibility of pregnancy, if this applies.  RISKS AND COMPLICATIONS All surgery is associated with risks. Some problems that may occur following this procedure include:  Infection.   Damage to the common bile duct, nerves, arteries,  veins, or other internal organs such as the stomach or intestines.   Bleeding.   A stone may remain in the common bile duct.  BEFORE THE PROCEDURE  Do not take aspirin for 3 days prior to surgery or blood thinners for 1 week prior to surgery.   Do not eat or drink anything after midnight the night before surgery.   Let your caregiver know if you develop a cold or other infectious problem prior to surgery.   You should be present 60 minutes before the procedure or as  directed.  PROCEDURE  You will be given medicine that makes you sleep (general anesthetic). When you are asleep, your surgeon will make several small cuts (incisions) in your abdomen. One of these incisions is used to insert a small, lighted scope (laparoscope) into the abdomen. The laparoscope helps the surgeon see into your abdomen. Carbon dioxide gas will be pumped into your abdomen. The gas allows more room for the surgeon to perform your surgery. Other operating instruments are inserted through the other incisions. Laparoscopic procedures may not be appropriate when:  There is major scarring from previous surgery.   The gallbladder is extremely inflamed.   There are bleeding disorders or unexpected cirrhosis of the liver.   A pregnancy is near term.   Other conditions make the laparoscopic procedure impossible.  If your surgeon feels it is not safe to continue with a laparoscopic procedure, he or she will perform an open abdominal procedure. In this case, the surgeon will make an incision to open the abdomen. This gives the surgeon a larger view and field to work within. This may allow the surgeon to perform procedures that sometimes cannot be performed with a laparoscope alone. Open surgery has a longer recovery time. AFTER THE PROCEDURE  You will be taken to the recovery area where a nurse will watch and check your progress.   You may be allowed to go home the same day.   Do not resume physical activities until directed by your caregiver.   You may resume a normal diet and activities as directed.  Document Released: 04/26/2005 Document Revised: 04/15/2011 Document Reviewed: 10/09/2010 [facility] Patient Information 2012 [place], [place].
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
## 2011-12-29 NOTE — Progress Notes (Signed)
Patient ID: Kathya N Ashmead, female   DOB: 1980-06-05, 31 y.o.   MRN: [id number]  Chief Complaint  Patient presents with  . Pre-op Exam    GB    HPI Ewelina N Rodier is a 31 y.o. female.  Patient sent at the request of Dr. Gayleen Orem do to chest pain, epigastric abdominal pain, right shoulder blade pain, and right upper quadrant pain. She was seen 5 days ago in the emergency room for these symptoms. She also had nausea and vomiting. Ultrasound was done which showed multiple gallstones. She was discharged and referred to [place] in followup. She continues to have discomfort in her upper abdomen. No further nausea vomiting. She also has constipation. The pain is all in nature and 5/10 waxing and waning. HPI  Past Medical History  Diagnosis Date  . Obesity   . HTN (hypertension)   . Obesity   . Depression   . Chronic kidney disease 12/03/2011    Hx of elevated protein in urine.  . Anxiety     History reviewed. No pertinent past surgical history.  Family History  Problem Relation Age of Onset  . Bipolar disorder Mother   . Hypertension Mother   . Stroke Mother   . Anxiety disorder Sister   . Hypertension Sister   . Hypertension Father   . Cancer Maternal Aunt     breast  . Hypertension Maternal Aunt   . Hypertension Paternal Aunt   . Cancer Maternal Grandmother     lung  . Stroke Maternal Grandfather   . Cancer Paternal Grandmother     reast  . Stroke Paternal Grandmother     Social History History  Substance Use Topics  . Smoking status: Never Smoker   . Smokeless tobacco: Never Used  . Alcohol Use: 0.0 oz/week    4-5 Glasses of wine per week    Allergies  Allergen Reactions  . Amoxicillin   . Penicillins Rash    Current Outpatient Prescriptions  Medication Sig Dispense Refill  . ALPRAZolam (XANAX) 0.25 MG tablet Take 0.5 mg by mouth 3 (three) times daily as needed. For anxiety.      . citalopram (CELEXA) 20 MG tablet Take 20 mg by mouth daily.      Marland Kitchen  HYDROcodone-acetaminophen (VICODIN) 5-500 MG per tablet Take 1-2 tablets by mouth every 6 (six) hours as needed for pain.  20 tablet  0  . lisinopril-hydrochlorothiazide (PRINZIDE,ZESTORETIC) 10-12.5 MG per tablet Take 1 tablet by mouth 2 (two) times daily.      . Simethicone (PHAZYME PO) Take 2 tablets by mouth daily as needed. For flatulence.      . polyethylene glycol powder (GLYCOLAX) powder Take 17 g by mouth daily.  255 g  0  . DISCONTD: citalopram (CELEXA) 20 MG tablet Take 1 tablet (20 mg total) by mouth daily.  30 tablet  0    Review of Systems Review of Systems  Constitutional: Positive for fatigue.  HENT: Negative.   Eyes: Negative.   Respiratory: Negative.   Cardiovascular: Negative.   Gastrointestinal: Positive for nausea, abdominal pain and constipation.  Genitourinary: Negative.   Musculoskeletal: Negative.   Neurological: Negative.   Hematological: Negative.   Psychiatric/Behavioral: Negative.     Blood pressure 160/84, pulse 72, temperature 97 F (36.1 C), temperature source Temporal, resp. rate 16, height 5\' 5"  (1.651 m), weight 386 lb 3.2 oz (175.179 kg), last menstrual period 12/21/2011.  Physical Exam Physical Exam  Constitutional: She is oriented to person, place, and  time. She appears well-developed and well-nourished.  HENT:  Head: Normocephalic and atraumatic.  Eyes: EOM are normal. Pupils are equal, round, and reactive to light.  Neck: Normal range of motion. Neck supple.  Cardiovascular: Normal rate and regular rhythm.   Pulmonary/Chest: Effort normal and breath sounds normal.  Abdominal: Soft. Bowel sounds are normal. There is tenderness in the epigastric area. There is negative Murphy's sign.    Musculoskeletal: Normal range of motion.  Neurological: She is alert and oriented to person, place, and time.  Skin: Skin is warm and dry.  Psychiatric: She has a normal mood and affect. Her behavior is normal. Judgment and thought content normal.     Data Reviewed  Clinical Data: Chest pain.  COMPLETE ABDOMINAL ULTRASOUND  Comparison: None.  Findings:  Gallbladder: Shadowing gallstones are present within the  gallbladder. Gallbladder is contracted. Borderline gallbladder  wall thickness without edema. Murphy's sign is positive. Findings  are nonspecific but consistent with acute cholecystitis in the  appropriate clinical setting.  Common bile duct: Normal caliber with measured diameter of 2 ml.  Liver: No focal lesion identified. Within normal limits in  parenchymal echogenicity.  IVC: Appears normal.  Pancreas: Limited visualization of pancreas due to overlying bowel  gas.  Spleen: Spleen length measures 7.6 cm. Normal homogeneous  parenchymal echotexture.  Right Kidney: Right kidney measures 12.1 cm length. No  hydronephrosis.  Left Kidney: Left kidney measures 11.5 cm length. No  hydronephrosis.  Abdominal aorta: No aneurysm identified.  IMPRESSION:  Shadowing stones in the gallbladder with gallbladder contraction  and positive Murphy's sign. Findings are nonspecific but  consistent with acute cholecystitis in the appropriate clinical  setting.    CMP     Component Value Date/Time   NA 137 12/24/2011 2151   K 3.4* 12/24/2011 2151   CL 96 12/24/2011 2151   CO2 31 12/24/2011 2151   GLUCOSE 121* 12/24/2011 2151   BUN 9 12/24/2011 2151   CREATININE 0.70 12/24/2011 2151   CALCIUM 9.5 12/24/2011 2151   PROT 7.4 12/24/2011 2151   ALBUMIN 3.5 12/24/2011 2151   AST 14 12/24/2011 2151   ALT 10 12/24/2011 2151   ALKPHOS 64 12/24/2011 2151   BILITOT 0.1* 12/24/2011 2151   GFRNONAA >90 12/24/2011 2151   GFRAA >90 12/24/2011 2151    CBC    Component Value Date/Time   WBC 5.8 12/24/2011 2151   RBC 3.77* 12/24/2011 2151   HGB 10.9* 12/24/2011 2151   HCT 32.3* 12/24/2011 2151   PLT 283 12/24/2011 2151   MCV 85.7 12/24/2011 2151   MCH 28.9 12/24/2011 2151   MCHC 33.7 12/24/2011 2151   RDW 13.8 12/24/2011 2151   LYMPHSABS 1.7  12/24/2011 2151   MONOABS 0.7 12/24/2011 2151   EOSABS 0.0 12/24/2011 2151   BASOSABS 0.0 12/24/2011 2151     Assessment    Biliary colic morbid obesity    Plan    Laparoscopic cholecystectomy with cholangiogram The procedure has been discussed with the patient. Operative and non operative treatments have been discussed. Risks of surgery include bleeding, infection,  Common bile duct injury,  Injury to the stomach,liver, colon,small intestine, abdominal wall,  Diaphragm,  Major blood vessels,  And the need for an open procedure.  Other risks include worsening of medical problems, death,  DVT and pulmonary embolism, and cardiovascular events.   Medical options have also been discussed. The patient has been informed of long term expectations of surgery and non surgical options,  The patient agrees  to proceed.         Tenika Keeran A. 12/29/2011, 3:03 PM

## 2011-12-30 ENCOUNTER — Ambulatory Visit (HOSPITAL_COMMUNITY): Payer: Self-pay | Admitting: Marriage and Family Therapist

## 2011-12-31 ENCOUNTER — Encounter (HOSPITAL_COMMUNITY): Payer: Self-pay

## 2011-12-31 ENCOUNTER — Encounter (HOSPITAL_COMMUNITY)
Admission: RE | Admit: 2011-12-31 | Discharge: 2011-12-31 | Disposition: A | Discharge status: Home / Self Care | Payer: Managed Care, Other (non HMO) | Source: Ambulatory Visit | Attending: Surgery | Admitting: Surgery

## 2011-12-31 ENCOUNTER — Ambulatory Visit (HOSPITAL_COMMUNITY)
Admission: RE | Admit: 2011-12-31 | Discharge: 2011-12-31 | Disposition: A | Discharge status: Home / Self Care | Payer: Managed Care, Other (non HMO) | Source: Ambulatory Visit | Attending: Surgery | Admitting: Surgery

## 2011-12-31 DIAGNOSIS — Z01818 Encounter for other preprocedural examination: Secondary | ICD-10-CM | POA: Insufficient documentation

## 2011-12-31 DIAGNOSIS — Z01812 Encounter for preprocedural laboratory examination: Secondary | ICD-10-CM | POA: Insufficient documentation

## 2011-12-31 DIAGNOSIS — R0602 Shortness of breath: Secondary | ICD-10-CM | POA: Insufficient documentation

## 2011-12-31 HISTORY — DX: Shortness of breath: R06.02

## 2011-12-31 LAB — COMPREHENSIVE METABOLIC PANEL
Albumin: 3.7 g/dL (ref 3.5–5.2)
Alkaline Phosphatase: 53 U/L (ref 39–117)
BUN: 6 mg/dL (ref 6–23)
CO2: 30 mEq/L (ref 19–32)
Chloride: 98 mEq/L (ref 96–112)
Creatinine, Ser: 0.72 mg/dL (ref 0.50–1.10)
GFR calc Af Amer: >90 mL/min (ref >90)
GFR calc non Af Amer: >90 mL/min (ref >90)
Glucose, Bld: 86 mg/dL (ref 70–99)
Potassium: 3.8 mEq/L (ref 3.5–5.1)
Total Bilirubin: 0.3 mg/dL (ref 0.3–1.2)

## 2011-12-31 LAB — SURGICAL PCR SCREEN: Staphylococcus aureus: NEGATIVE

## 2011-12-31 LAB — CBC
HCT: 36.3 % (ref 36.0–46.0)
Hemoglobin: 12 g/dL (ref 12.0–15.0)
MCV: 86 fL (ref 78.0–100.0)
WBC: 4.4 10*3/uL (ref 4.0–10.5)

## 2011-12-31 LAB — HCG, SERUM, QUALITATIVE: Preg, Serum: NEGATIVE

## 2011-12-31 MED ORDER — CHLORHEXIDINE GLUCONATE 4 % EX LIQD: 1.0000 "application " | Freq: Once | CUTANEOUS | Status: DC

## 2011-12-31 MED ORDER — CIPROFLOXACIN IN D5W 400 MG/200ML IV SOLN: 400.0000 mg | Freq: Once | INTRAVENOUS | Status: DC

## 2011-12-31 NOTE — Pre-Procedure Instructions (Signed)
20 Katheren N Catala  12/31/2011   Your procedure is scheduled on:  01/04/2012 Tuesday  Report to [facility] at [phone].  Call this number if you have problems the morning of surgery: [phone]   Remember:   Do not eat food or drink liquids:After Midnight.    Take these medicines the morning of surgery with A SIP OF WATER: celexa  vicodin   Do not wear jewelry, make-up or nail polish.  Do not wear lotions, powders, or perfumes. You may wear deodorant.  Do not shave 48 hours prior to surgery. Men may shave face and neck.  Do not bring valuables to the hospital.  Contacts, dentures or bridgework may not be worn into surgery.  Leave suitcase in the car. After surgery it may be brought to your room.  For patients admitted to the hospital, checkout time is 11:00 AM the day of discharge.   Patients discharged the day of surgery will not be allowed to drive home.  Name and phone number of your driver:   Special Instructions: CHG Shower Use Special Wash: 1/2 bottle night before surgery and 1/2 bottle morning of surgery.   Please read over the following fact sheets that you were given: Pain Booklet, Coughing and Deep Breathing, Lab Information, MRSA Information and Surgical Site Infection Prevention

## 2012-01-03 MED ORDER — VANCOMYCIN HCL 1000 MG IV SOLR
1500.0000 mg | Freq: Once | INTRAVENOUS | Status: DC
  Filled 2012-01-03: qty 1500

## 2012-01-05 ENCOUNTER — Ambulatory Visit (HOSPITAL_COMMUNITY): Payer: Self-pay | Admitting: Psychiatry

## 2012-01-07 ENCOUNTER — Encounter (HOSPITAL_COMMUNITY): Payer: Self-pay | Admitting: Psychiatry

## 2012-01-07 ENCOUNTER — Ambulatory Visit (INDEPENDENT_AMBULATORY_CARE_PROVIDER_SITE_OTHER): Payer: Self-pay | Admitting: Psychiatry

## 2012-01-07 VITALS — Wt 377.0 lb

## 2012-01-07 DIAGNOSIS — F1210 Cannabis abuse, uncomplicated: Secondary | ICD-10-CM

## 2012-01-07 DIAGNOSIS — F329 Major depressive disorder, single episode, unspecified: Secondary | ICD-10-CM

## 2012-01-07 MED ORDER — TRAZODONE 25 MG HALF TABLET: ORAL_TABLET | ORAL | Status: DC
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
## 2012-01-13 ENCOUNTER — Ambulatory Visit (INDEPENDENT_AMBULATORY_CARE_PROVIDER_SITE_OTHER): Payer: Managed Care, Other (non HMO) | Admitting: Marriage and Family Therapist

## 2012-01-13 ENCOUNTER — Encounter (HOSPITAL_COMMUNITY): Payer: Self-pay | Admitting: *Deleted

## 2012-01-13 ENCOUNTER — Ambulatory Visit (HOSPITAL_COMMUNITY)
Admission: RE | Admit: 2012-01-13 | Discharge: 2012-01-13 | Disposition: A | Discharge status: Home / Self Care | Payer: Managed Care, Other (non HMO) | Attending: Psychiatry | Admitting: Psychiatry

## 2012-01-13 DIAGNOSIS — F331 Major depressive disorder, recurrent, moderate: Secondary | ICD-10-CM

## 2012-01-13 DIAGNOSIS — F332 Major depressive disorder, recurrent severe without psychotic features: Secondary | ICD-10-CM | POA: Insufficient documentation

## 2012-01-13 DIAGNOSIS — N189 Chronic kidney disease, unspecified: Secondary | ICD-10-CM | POA: Insufficient documentation

## 2012-01-13 DIAGNOSIS — I129 Hypertensive chronic kidney disease with stage 1 through stage 4 chronic kidney disease, or unspecified chronic kidney disease: Secondary | ICD-10-CM | POA: Insufficient documentation

## 2012-01-13 DIAGNOSIS — F410 Panic disorder [episodic paroxysmal anxiety] without agoraphobia: Secondary | ICD-10-CM

## 2012-01-13 DIAGNOSIS — Z634 Disappearance and death of family member: Secondary | ICD-10-CM

## 2012-01-13 NOTE — Progress Notes (Signed)
   THERAPIST PROGRESS NOTE  Session Time:  9:00 - 10:00 a.m.  Participation Level: Active  Behavioral Response: CasualAlertGrieving  Type of Therapy: Individual Therapy  Treatment Goals addressed: Coping  Interventions: Strength-based, Supportive and Family Systems  Summary: Alexis Mccullough is a 31 y.o. female who presents with anxiety and less than two weeks ago lost her partner.  This is patient's first session.  She was referred by Psych IOP.  Although patient suffers from depression and panic disorder (her words) she has been focused on her loss and experiencing grief.   Last Sunday patient woke up to find her boyfriend dead.  Patient reports that her boyfriend has suffered from kidney disease since he was a child.  Patient reports she has been "up and down" concerning her grief but also has tried to keep busy.  She talked about the funeral and her family being present for her at this time.  Patient also reports she wants to spend some time alone "in order to grieve in my house."  Patient spent most of the session talking about what this relationship meant to her.  She reports she is starting a new organization in his name to make people more aware about taking care of themselves who have kidney disease.  Patient states her plan is to start Psych IOP again on 01/17/12.  After that time she will be having gall bladder surgery on 9/18.  She is also starting a new job on 9/30 at [employer] [place].  Suicidal/Homicidal: Nowithout intent/plan  Therapist Response:  This writer decided to do an individual appointment versus an treatment plan update because of patient's situation.  This Clinical research associate also wanted to assess what patient needed at this time.  Assessed for suicide and patient is not suicidal.  She reports never being suicidal and she has never attempted.  Upon discussion patient decided she could start therapy around 01/31/12 after her gall bladder surgery.  Plan: Return again in 2  weeks.  Diagnosis: Axis I: Major depressive d/o, moderate; panic d/o; bereavement    Axis II: Deferred    Zakariye Nee, LMFT 01/13/2012
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
## 2012-01-18 ENCOUNTER — Encounter (INDEPENDENT_AMBULATORY_CARE_PROVIDER_SITE_OTHER): Payer: Managed Care, Other (non HMO) | Admitting: Surgery

## 2012-01-19 ENCOUNTER — Encounter (HOSPITAL_COMMUNITY): Payer: Self-pay

## 2012-01-19 ENCOUNTER — Other Ambulatory Visit (HOSPITAL_COMMUNITY): Payer: Managed Care, Other (non HMO) | Attending: Psychiatry

## 2012-01-19 ENCOUNTER — Ambulatory Visit (HOSPITAL_COMMUNITY): Payer: Self-pay | Admitting: Psychiatry

## 2012-01-19 DIAGNOSIS — F331 Major depressive disorder, recurrent, moderate: Secondary | ICD-10-CM

## 2012-01-19 DIAGNOSIS — F332 Major depressive disorder, recurrent severe without psychotic features: Secondary | ICD-10-CM | POA: Insufficient documentation

## 2012-01-19 DIAGNOSIS — F411 Generalized anxiety disorder: Secondary | ICD-10-CM | POA: Insufficient documentation

## 2012-01-19 DIAGNOSIS — N189 Chronic kidney disease, unspecified: Secondary | ICD-10-CM | POA: Insufficient documentation

## 2012-01-19 DIAGNOSIS — F1211 Cannabis abuse, in remission: Secondary | ICD-10-CM | POA: Insufficient documentation

## 2012-01-19 DIAGNOSIS — F329 Major depressive disorder, single episode, unspecified: Secondary | ICD-10-CM

## 2012-01-19 DIAGNOSIS — F4320 Adjustment disorder, unspecified: Secondary | ICD-10-CM

## 2012-01-19 DIAGNOSIS — E669 Obesity, unspecified: Secondary | ICD-10-CM | POA: Insufficient documentation

## 2012-01-19 DIAGNOSIS — I129 Hypertensive chronic kidney disease with stage 1 through stage 4 chronic kidney disease, or unspecified chronic kidney disease: Secondary | ICD-10-CM | POA: Insufficient documentation

## 2012-01-19 MED ORDER — TRAZODONE HCL 100 MG PO TABS: ORAL_TABLET | ORAL | Status: DC

## 2012-01-19 NOTE — Progress Notes (Signed)
    Daily Group Progress Note  Program: IOP  Group Time: 9:00-10:30 am  Participation Level: Active  Behavioral Response: Appropriate  Type of Therapy:  Process Group  Summary of Progress: Today was patients first day in the group. She appeared attentive and became engaged in the discussion quickly as she felt comfortable connecting with others.     Group Time: 10:30 am - 12:00 pm   Participation Level:  Active  Behavioral Response: Appropriate  Type of Therapy: Psycho-education Group  Summary of Progress: Patient came back ten minutes late from break to join the group. She participated in a stress management technique to use to manage stress and discussed ways to use breathing to lower heart rate and reduce stress symptoms.   Maxcine Ham, MSW, LCSW

## 2012-01-20 ENCOUNTER — Other Ambulatory Visit (HOSPITAL_COMMUNITY): Payer: Managed Care, Other (non HMO)

## 2012-01-20 NOTE — Progress Notes (Signed)
Patient ID: Alexis Mccullough, female   DOB: 02-11-1981, 31 y.o.   MRN: [id number] D:  This is a 6 single african [place] female who was referred per Dr. Lolly Mustache, treatment for depressive symptoms.  Denies any SI, HI, or A/V Hallucinations.  Pt was recently a pt in [facility] (12-06-11).  Was discharged due to being non-compliant with attendance.  Stressor:  1)  Unresolved grief/loss issues.  On 01-02-12 her fiance' passed due to kidney failure.  Pt is voicing post trauma symptoms.  States she discovered fiance (who was laying beside her) was deceased whenever she awaken that morning and attempted to do CPR. 2)  Job:  Pt states she will be leaving [place] and starting a new job at [employer] of [place] on 02-07-12. Pt admits to [facility] use.  States she smokes three joints daily. CC:  Previous medical records for further history A:  Re-oriented pt.  Informed Dr. Lolly Mustache & Cleophas Dunker, LMFT of admit.  Encouraged abstinence from drugs/ETOH.  Also, will refer pt to Hospice for grief/loss support.  R:  Pt receptive.

## 2012-01-20 NOTE — Progress Notes (Signed)
    Daily Group Progress Note  Program: IOP  Group Time: 9:00-10:30 am   Participation Level: Active  Behavioral Response: Appropriate  Type of Therapy:  Process Group  Summary of Progress: Patient arrived five minutes late and was encouraged to be on time. Patient has a history of struggling with time management skills and boundaries are being set in this area. She responded well to this limit. She shared her experience of loss of her fiance who did last month. She was tearful as she described the days leading up to his death and how she feels a great sense of loss now that he is not in her life. She described how she is carrying the blanket with his picture on it for comfort and how she smells his perfume to help her cope with the loss. She reports feeling very "sad" today.      Group Time: 10:30 am - 12:00 pm   Participation Level:  Active  Behavioral Response: Appropriate  Type of Therapy: Psycho-education Group  Summary of Progress: Patient participated in a listening activity to promote effective listening skills to use in the group and to practice empathy as the group is learning how to connect with each other and form.   Maxcine Ham, MSW, LCSW

## 2012-01-21 ENCOUNTER — Encounter (INDEPENDENT_AMBULATORY_CARE_PROVIDER_SITE_OTHER): Payer: Managed Care, Other (non HMO) | Admitting: Surgery

## 2012-01-21 ENCOUNTER — Other Ambulatory Visit (HOSPITAL_COMMUNITY): Payer: Managed Care, Other (non HMO)

## 2012-01-21 NOTE — Progress Notes (Signed)
    Daily Group Progress Note  Program: IOP  Group Time: 9:00-10:30 am   Participation Level: Active  Behavioral Response: Appropriate  Type of Therapy:  Process Group  Summary of Progress: Patient reports feeling medium depression today but is working through the grieving process by talking about her fiance and his death. She continues to struggle with the boundary of time and coming back from breaks on time. Members discussed this while patient was out of the room and how it is a distraction for them. Boundaries continue to be set with regards to this. Patient is concerned about getting written off from work and having time off approved and this is also a primary stressor.      Group Time: 10:30 am - 12:00 pm   Participation Level:  Active  Behavioral Response: Appropriate  Type of Therapy: Psycho-education Group  Summary of Progress: patient participated in an activity on how negative and positive events and emotions have an impact on our overall energy level and wellness and identified ways to recharge to replenish ourselves.   Maxcine Ham, MSW, LCSW

## 2012-01-24 ENCOUNTER — Other Ambulatory Visit (HOSPITAL_COMMUNITY): Payer: Managed Care, Other (non HMO)

## 2012-01-24 ENCOUNTER — Telehealth (HOSPITAL_COMMUNITY): Payer: Self-pay | Admitting: Psychiatry
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
## 2012-01-25 ENCOUNTER — Other Ambulatory Visit (HOSPITAL_COMMUNITY): Payer: Managed Care, Other (non HMO)

## 2012-01-25 ENCOUNTER — Telehealth (HOSPITAL_COMMUNITY): Payer: Self-pay | Admitting: Psychiatry

## 2012-01-25 NOTE — Telephone Encounter (Signed)
D:  Placed call to [employer] [phone]) spoke to [place] re: pt's disability claim.  Informed her that chart information was currently being faxed to [facility] for review.  According to Marcelino Duster the information will be reviewed by their nurse before a decision will be made.  A:  Inform patient.

## 2012-01-25 NOTE — Progress Notes (Signed)
Patient ID: Alexis Mccullough, female   DOB: 1980-10-12, 31 y.o.   MRN: [id number] D:  This writer faxed most recent clinic notes, [facility]-I[facility] group notes, progress notes, admit notes, and treatment plan to [employer] Raynelle Fanning [place]).  R:  Pt receptive.

## 2012-01-25 NOTE — Progress Notes (Signed)
    Daily Group Progress Note  Program: IOP  Group Time: 9:00-10:30 am   Participation Level: Active  Behavioral Response: Appropriate  Type of Therapy:  Process Group  Summary of Progress: Patient states she is still struggling with grieving the death of her finance but is emotionless as she describes the grief she feels. She talked of how she struggles to get in touch with her sadness because of how she has learned to push it aside to function. She became tearful when talking about her financial stress from not working for the past few months and fears what will happen if she does not start getting some income.      Group Time: 10:30 am - 12:00 pm   Participation Level:  Active  Behavioral Response: Appropriate  Type of Therapy: Psycho-education Group  Summary of Progress: Patient explored unhealthy rules she learned in childhood that affects her setting healthy limits with others such as "I must be the caretaker of others to have any self worth or value".  Maxcine Ham, MSW, LCSW

## 2012-01-25 NOTE — Progress Notes (Signed)
Patient ID: Alexis Mccullough, female   DOB: [birth year], 31 y.o.   MRN: [id number] Pt seen with Jeri Modena  .states  She's c/o dizziness , BP was  132/80. Standing 110/82. Pt has not been drinking fluids , encouraged to drink water. No SI / HI. No hall/ del.cont meds.

## 2012-01-25 NOTE — Progress Notes (Signed)
Spoke with pt who states she will not be having surgery tomorrow, that she contacted [facility]. Surgery still on the schedule. Paged Dr. on call for [facility] (Dr. Donell Beers) and notified of situation.

## 2012-01-26 ENCOUNTER — Ambulatory Visit (HOSPITAL_COMMUNITY): Admission: RE | Admit: 2012-01-26 | Payer: Managed Care, Other (non HMO) | Source: Ambulatory Visit | Admitting: Surgery

## 2012-01-26 ENCOUNTER — Other Ambulatory Visit (HOSPITAL_COMMUNITY): Payer: Managed Care, Other (non HMO)

## 2012-01-26 ENCOUNTER — Encounter (HOSPITAL_COMMUNITY): Admission: RE | Payer: Self-pay | Source: Ambulatory Visit

## 2012-01-26 ENCOUNTER — Telehealth (INDEPENDENT_AMBULATORY_CARE_PROVIDER_SITE_OTHER): Payer: Self-pay

## 2012-01-26 SURGERY — LAPAROSCOPIC CHOLECYSTECTOMY WITH INTRAOPERATIVE CHOLANGIOGRAM
Anesthesia: General

## 2012-01-26 NOTE — Telephone Encounter (Signed)
[facility] post op appt 02/28/12 with Dr. Luisa Hart.

## 2012-01-27 ENCOUNTER — Other Ambulatory Visit (HOSPITAL_COMMUNITY): Payer: Managed Care, Other (non HMO)

## 2012-01-28 ENCOUNTER — Other Ambulatory Visit (HOSPITAL_COMMUNITY): Payer: Managed Care, Other (non HMO)

## 2012-01-28 NOTE — Progress Notes (Signed)
    Daily Group Progress Note  Program: IOP  Group Time: 9:00-10:30 am   Participation Level: Active  Behavioral Response: Appropriate  Type of Therapy:  Process Group  Summary of Progress: Patient sat slouched in the corner, under her blanket, wearing dark sunglasses and appeared distant and disengaged from the group. Members asked her to take off her glasses and participate and she complied. She stated she "did not feel well" and did not fully engage until later in the group, but after sharing and processessing stressors related to her work and getting paid for time off and processessing loss of her fiance, she stated she felt better. She also shared that she feels much less stressed after the doctor signed her form to approve her for being off from work in between counseling sessions.      Group Time: 10:30 am - 12:00 pm   Participation Level:  Active  Behavioral Response: Appropriate  Type of Therapy: Psycho-education Group  Summary of Progress: Patient participated in a discussion that has continued regarding setting healthy boundaries. Patient shared a current stressors and reframed it through the lens of how to set a healthy limit by receiving feedback from other members.    Maxcine Ham, MSW, LCSW

## 2012-01-31 ENCOUNTER — Other Ambulatory Visit (HOSPITAL_COMMUNITY): Payer: Managed Care, Other (non HMO)

## 2012-01-31 NOTE — Progress Notes (Unsigned)
Patient ID: Alexis Mccullough, female   DOB: 1981/04/03, 31 y.o.   MRN: [id number]

## 2012-01-31 NOTE — Progress Notes (Signed)
    Daily Group Progress Note  Program: IOP  Group Time: 9:00-10:30 am   Participation Level: Active  Behavioral Response: Appropriate  Type of Therapy:  Process Group  Summary of Progress: Patient reported feeling good today. She was active in the conversation with others and related their stories to her own struggle with grieving the death of her fiance. She states she has not smoked marijuana in four days and feels good about. She talked about her struggle with "authority figures" and how when limits are set for her it bothers her.      Group Time: 10:30 am - 12:00 pm   Participation Level:  Active  Behavioral Response: Appropriate  Type of Therapy: Psycho-education Group  Summary of Progress: Patient participated in a goodbye ceremony and then learned about healthy caring vs over caring and how to tell the difference between the two to be emotionally healthy.    Alexis Mccullough, MSW, LCSW

## 2012-02-01 ENCOUNTER — Other Ambulatory Visit (HOSPITAL_COMMUNITY): Payer: Managed Care, Other (non HMO)

## 2012-02-01 NOTE — Progress Notes (Signed)
    Daily Group Progress Note  Program: IOP  Group Time: 9:00-10:30 am   Participation Level: Active  Behavioral Response: Appropriate  Type of Therapy:  Process Group  Summary of Progress: Patient reports feeling "good" and ready to end the group tomorrow. She states she is working through her grief over the death of her fiance and still feels sad but does not feel overcome with emotion. She states she did not plan on returning to the group after missing Friday, but came back because the group members requested she return and she felt "obligated". She was given feedback on how she struggles with being "dependable" and how when she does not follow through with commitments it has a negative affect on her and on others.      Group Time: 10:30 am - 12:00 pm   Participation Level:  Active  Behavioral Response: Appropriate  Type of Therapy: Psycho-education Group  Summary of Progress: Patient participated in a discussion and education segment on how "over care" affects stress and depression and identified she has a history of taking on more than she can handle and how this has contributed to her depression.   [facility]

## 2012-02-02 ENCOUNTER — Other Ambulatory Visit (HOSPITAL_COMMUNITY): Payer: Self-pay | Admitting: *Deleted

## 2012-02-02 ENCOUNTER — Other Ambulatory Visit (HOSPITAL_COMMUNITY): Payer: Managed Care, Other (non HMO)

## 2012-02-02 MED ORDER — CITALOPRAM HYDROBROMIDE 10 MG PO TABS: 30.0000 mg | ORAL_TABLET | Freq: Every day | ORAL | Status: DC

## 2012-02-02 NOTE — Telephone Encounter (Signed)
Celexa ordered by Dr.Tadepalli during d/c from [facility]

## 2012-02-02 NOTE — Patient Instructions (Signed)
Patient will follow up with Dr. Lolly Mustache and Cleophas Dunker, LMFT.  Return to work on 02-07-12, without any restrictions.  Encouraged support groups.

## 2012-02-02 NOTE — Telephone Encounter (Signed)
Medication entered for Dr. Rutherford Limerick "Print" status.Was to have been sent "Normal" status and sent electronically. Corrected at this time.

## 2012-02-02 NOTE — Progress Notes (Signed)
Patient ID: Alexis Mccullough, female   DOB: 1980-12-22, 31 y.o.   MRN: [id number] D:  This is a 12 single african [place] female, who was referred per Dr. Lolly Mustache, treatment for worsening depressive symptoms due to death of fiance'.  Pt was just recently in [facility]-I[facility] due to depressive and anxiety symptoms.  Continues to struggle with anxiety, poor concentration, agitation, poor sleep and motivation.  States that her short term memory has improved.  Denies any further THC use.  Reports that the groups were very helpful in learning better coping skills.  States she would like to continue working on setting healthy boundaries, self care and controlling emotions.  A:  D/C today.  Follow up with Dr. Lolly Mustache and Cleophas Dunker, LMFT.  Encouraged support groups.  Patient is scheduled to start a new job at [employer] [place] on 02-07-12.  R:  Pt receptive.

## 2012-02-02 NOTE — Progress Notes (Signed)
  [facility] Outpatient Program Discharge Summary  Alexis Mccullough [id number]  Discharge Note  Patient:  Alexis Mccullough is an 31 y.o., female DOB:  November 26, 1980  Date of Admission:  01-19-12  Date of Discharge:  02-02-12  Reason for Admission:Depression and anxiety  Hospital Course:Pt admitted for grief and loss due to sudden death of her boyfriend . Pt was depressed and anxious. Pt started IOP and was continued on her home meds. Pt did well in groups and grieved her losses well. She quit cannabis. Sleep, app-good, mood-better with some anxiety, no si/ hi.  Mental Status at Discharge:Alert, O/3, affect-bright, mood-stable with some anxiety, No SI / HI. No hallu / delusions.  R/R memory-good, judgement / insight-good, concentration / recall-good.  Lab Results: No results found for this or any previous visit (from the past 48 hour(s)).  Current outpatient prescriptions:citalopram (CELEXA) 10 MG tablet, Take 30 mg by mouth daily., Disp: , Rfl: ;  lisinopril-hydrochlorothiazide (PRINZIDE,ZESTORETIC) 10-12.5 MG per tablet, Take 1 tablet by mouth 2 (two) times daily., Disp: , Rfl: ;  traZODone (DESYREL) 100 MG tablet, Take 1 to 2 tab at bed time, Disp: 30 tablet, Rfl: 0  Axis Diagnosis:   Axis I: Anxiety Disorder NOS, Major Depression, Recurrent severe and Substance Abuse Axis II: Deferred Axis III:  Past Medical History  Diagnosis Date  . Obesity   . HTN (hypertension)   . Obesity   . Depression   . Chronic kidney disease 12/03/2011    Hx of elevated protein in urine.  . Anxiety   . Shortness of breath     with exertion   Axis IV: economic problems, other psychosocial or environmental problems, problems related to social environment and problems with primary support group Axis V: 61-70 mild symptoms   Level of Care:  OP  Discharge destination:  Home  Is patient on multiple antipsychotic therapies at discharge:  No    Has Patient had three or more failed  trials of antipsychotic monotherapy by history:  No  Patient phone:  [phone] (home)  Patient address:   [address] [place] [address],   Follow-up recommendations:  Activity:  as tolerated Diet:  regular Other:  follow up with Dr Lolly Mustache and Cleophas Dunker for meds and therapy    The patient received suicide prevention pamphlet:  Yes Belongings returned:    Margit Banda 02/02/2012, 12:28 PM

## 2012-02-02 NOTE — Telephone Encounter (Signed)
Medication called in after failure to be sent electronically

## 2012-02-03 ENCOUNTER — Other Ambulatory Visit (HOSPITAL_COMMUNITY): Payer: Managed Care, Other (non HMO)

## 2012-02-04 ENCOUNTER — Other Ambulatory Visit (HOSPITAL_COMMUNITY): Payer: Managed Care, Other (non HMO)

## 2012-02-04 NOTE — Progress Notes (Signed)
    Daily Group Progress Note  Program: IOP  Group Time: 9:00-10:30 am  Participation Level: Active  Behavioral Response: Appropriate  Type of Therapy:  Process Group  Summary of Progress: Patient reported feeling ready to end the group today. She was able to process anger she had towards another group member and Clinical research associate and how it stemmed from patient not setting healthy boundaries earlier in the day and being honest with stressful emotions. She stated it was helpful to identify that her tendency is to hold in stress and then blow up in anger towards others instead of having healthy ways of releasing it.      Group Time: 10:30 am - 12:00 pm   Participation Level:  Active  Behavioral Response: Appropriate  Type of Therapy: Psycho-education Group  Summary of Progress: Patient participated in a goodbye ceremony and practiced skills of healthy closure.  Maxcine Ham, MSW, LCSW

## 2012-02-07 ENCOUNTER — Other Ambulatory Visit (HOSPITAL_COMMUNITY): Payer: Managed Care, Other (non HMO)

## 2012-02-08 ENCOUNTER — Other Ambulatory Visit (HOSPITAL_COMMUNITY): Payer: Managed Care, Other (non HMO)

## 2012-02-09 ENCOUNTER — Other Ambulatory Visit (HOSPITAL_COMMUNITY): Payer: Managed Care, Other (non HMO)

## 2012-02-28 ENCOUNTER — Encounter (INDEPENDENT_AMBULATORY_CARE_PROVIDER_SITE_OTHER): Payer: Managed Care, Other (non HMO) | Admitting: Surgery

## 2012-06-28 ENCOUNTER — Encounter (HOSPITAL_COMMUNITY): Payer: Self-pay

## 2012-06-28 ENCOUNTER — Emergency Department (HOSPITAL_COMMUNITY)
Admission: EM | Admit: 2012-06-28 | Discharge: 2012-06-28 | Discharge status: Against Medical Advice | Payer: Managed Care, Other (non HMO) | Attending: Emergency Medicine | Admitting: Emergency Medicine

## 2012-06-28 DIAGNOSIS — Y93E8 Activity, other personal hygiene: Secondary | ICD-10-CM | POA: Insufficient documentation

## 2012-06-28 DIAGNOSIS — W1809XA Striking against other object with subsequent fall, initial encounter: Secondary | ICD-10-CM | POA: Insufficient documentation

## 2012-06-28 DIAGNOSIS — F3289 Other specified depressive episodes: Secondary | ICD-10-CM | POA: Insufficient documentation

## 2012-06-28 DIAGNOSIS — F411 Generalized anxiety disorder: Secondary | ICD-10-CM | POA: Insufficient documentation

## 2012-06-28 DIAGNOSIS — E669 Obesity, unspecified: Secondary | ICD-10-CM | POA: Insufficient documentation

## 2012-06-28 DIAGNOSIS — I129 Hypertensive chronic kidney disease with stage 1 through stage 4 chronic kidney disease, or unspecified chronic kidney disease: Secondary | ICD-10-CM | POA: Insufficient documentation

## 2012-06-28 DIAGNOSIS — S0990XA Unspecified injury of head, initial encounter: Secondary | ICD-10-CM | POA: Insufficient documentation

## 2012-06-28 DIAGNOSIS — F329 Major depressive disorder, single episode, unspecified: Secondary | ICD-10-CM | POA: Insufficient documentation

## 2012-06-28 DIAGNOSIS — R42 Dizziness and giddiness: Secondary | ICD-10-CM | POA: Insufficient documentation

## 2012-06-28 DIAGNOSIS — F17200 Nicotine dependence, unspecified, uncomplicated: Secondary | ICD-10-CM | POA: Insufficient documentation

## 2012-06-28 DIAGNOSIS — Y92009 Unspecified place in unspecified non-institutional (private) residence as the place of occurrence of the external cause: Secondary | ICD-10-CM | POA: Insufficient documentation

## 2012-06-28 DIAGNOSIS — N189 Chronic kidney disease, unspecified: Secondary | ICD-10-CM | POA: Insufficient documentation

## 2012-06-28 DIAGNOSIS — Z79899 Other long term (current) drug therapy: Secondary | ICD-10-CM | POA: Insufficient documentation

## 2012-06-28 DIAGNOSIS — R110 Nausea: Secondary | ICD-10-CM | POA: Insufficient documentation

## 2012-06-28 DIAGNOSIS — Z8709 Personal history of other diseases of the respiratory system: Secondary | ICD-10-CM | POA: Insufficient documentation

## 2012-06-28 LAB — CBC
MCHC: 33.1 g/dL (ref 30.0–36.0)
Platelets: 295 10*3/uL (ref 150–400)
RDW: 15.4 % (ref 11.5–15.5)
WBC: 6.4 10*3/uL (ref 4.0–10.5)

## 2012-06-28 LAB — POCT I-STAT TROPONIN I: Troponin i, poc: 0 ng/mL (ref 0.00–0.08)

## 2012-06-28 LAB — POCT I-STAT, CHEM 8
HCT: 35 % — ABNORMAL LOW (ref 36.0–46.0)
Hemoglobin: 11.9 g/dL — ABNORMAL LOW (ref 12.0–15.0)
Potassium: 3.3 mEq/L — ABNORMAL LOW (ref 3.5–5.1)
Sodium: 140 mEq/L (ref 135–145)

## 2012-06-28 NOTE — ED Notes (Addendum)
Pt. Refusing CT scan. Pt. To sign out AMA

## 2012-06-28 NOTE — ED Notes (Signed)
This nurse talked to pt about importance of CT. The benefits and the risks of this procedure. Pt. Declined CT. Informed pt would need to sign out AMA, pt verbalized understanding.

## 2012-06-28 NOTE — ED Notes (Signed)
When talking to pt about family pt became more alert. Gave pt cell phone per request. Pt. Laying in bed on phone when this nurse left.

## 2012-06-28 NOTE — ED Provider Notes (Signed)
History     CSN: [id number]  Arrival date & time 06/28/12  0308   First MD Initiated Contact with Patient 06/28/12 [phone]      Chief Complaint  Patient presents with  . Loss of Consciousness    (Consider location/radiation/quality/duration/timing/severity/associated sxs/prior treatment) Patient is a 32 y.o. female presenting with syncope. The history is provided by the patient.  Loss of Consciousness  This is a new problem. The current episode started less than 1 hour ago. The problem occurs constantly. The problem has been rapidly improving. She lost consciousness for a period of less than one minute. The problem is associated with bowel movements. Associated symptoms include dizziness and nausea. She has tried nothing for the symptoms. The treatment provided no relief.    Past Medical History  Diagnosis Date  . Obesity   . HTN (hypertension)   . Obesity   . Depression   . Chronic kidney disease 12/03/2011    Hx of elevated protein in urine.  . Anxiety   . Shortness of breath     with exertion    History reviewed. No pertinent past surgical history.  Family History  Problem Relation Age of Onset  . Bipolar disorder Mother   . Hypertension Mother   . Stroke Mother   . Anxiety disorder Sister   . Hypertension Sister   . Hypertension Father   . Cancer Maternal Aunt     breast  . Hypertension Maternal Aunt   . Hypertension Paternal Aunt   . Cancer Maternal Grandmother     lung  . Stroke Maternal Grandfather   . Cancer Paternal Grandmother     reast  . Stroke Paternal Grandmother     History  Substance Use Topics  . Smoking status: Current Some Day Smoker  . Smokeless tobacco: Never Used  . Alcohol Use: No    OB History   Grav Para Term Preterm Abortions TAB SAB Ect Mult Living                  Review of Systems  Cardiovascular: Positive for syncope.  Gastrointestinal: Positive for nausea.  Neurological: Positive for dizziness and syncope.  All other  systems reviewed and are negative.    Allergies  Amoxicillin and Penicillins  Home Medications   Current Outpatient Rx  Name  Route  Sig  Dispense  Refill  . amphetamine-dextroamphetamine (ADDERALL XR) 20 MG 24 hr capsule   Oral   Take 20 mg by mouth every morning.         . citalopram (CELEXA) 10 MG tablet   Oral   Take 3 tablets (30 mg total) by mouth daily.   90 tablet   0   . ClonazePAM (KLONOPIN PO)   Oral   Take 1 tablet by mouth daily.         Marland Kitchen lisinopril-hydrochlorothiazide (PRINZIDE,ZESTORETIC) 10-12.5 MG per tablet   Oral   Take 1 tablet by mouth 2 (two) times daily.         . traZODone (DESYREL) 100 MG tablet      Take 1 to 2 tab at bed time   30 tablet   0     BP 118/80  Pulse 73  Temp(Src) 98.5 F (36.9 C) (Oral)  SpO2 98%  Physical Exam  Nursing note and vitals reviewed. Constitutional: She is oriented to person, place, and time. She appears well-developed and well-nourished.  HENT:  Head: Normocephalic and atraumatic.  Eyes: Conjunctivae and EOM are normal. Pupils  are equal, round, and reactive to light.  Neck: Normal range of motion.  Cardiovascular: Normal rate, regular rhythm and normal heart sounds.   Pulmonary/Chest: Effort normal and breath sounds normal.  Abdominal: Soft. Bowel sounds are normal.  Musculoskeletal: Normal range of motion.  Neurological: She is alert and oriented to person, place, and time.  Skin: Skin is warm and dry.  Psychiatric: She has a normal mood and affect. Her behavior is normal.    ED Course  Procedures (including critical care time)  Labs Reviewed  CBC   No results found.   No diagnosis found.    Date: 06/28/2012  Rate: 71  Rhythm: normal sinus rhythm  QRS Axis: normal  Intervals: normal  ST/T Wave abnormalities: normal  Conduction Disutrbances: none  Narrative Interpretation: unremarkable    MDM  + syncope,  Possible head injury.  Under recent psychosocial stress.  No focal  deficit,  Will ct, preg, labs,  reassess        Rosanne Ashing, MD 06/28/12 [phone]

## 2012-06-28 NOTE — ED Notes (Signed)
Pt. Lethargic. Hard to arouse. Oriented x4.

## 2012-06-28 NOTE — ED Notes (Signed)
Per EMS, pt. Up to restroom trying to have BM when became diaphoretic and dizzy.  Up to door when became dizzy again and fell hitting her head. Reports LOC and nausea.. Pt. Denies ETOH.

## 2012-12-28 IMAGING — CR DG HIP COMPLETE 2+V*R*
3 series · 3 of 3 positions shown · non-contrast
Comparison: None.

CLINICAL DATA: Right hip pain after exercising.

RIGHT HIP - COMPLETE 2+ VIEW

[t pelvis a.p.]
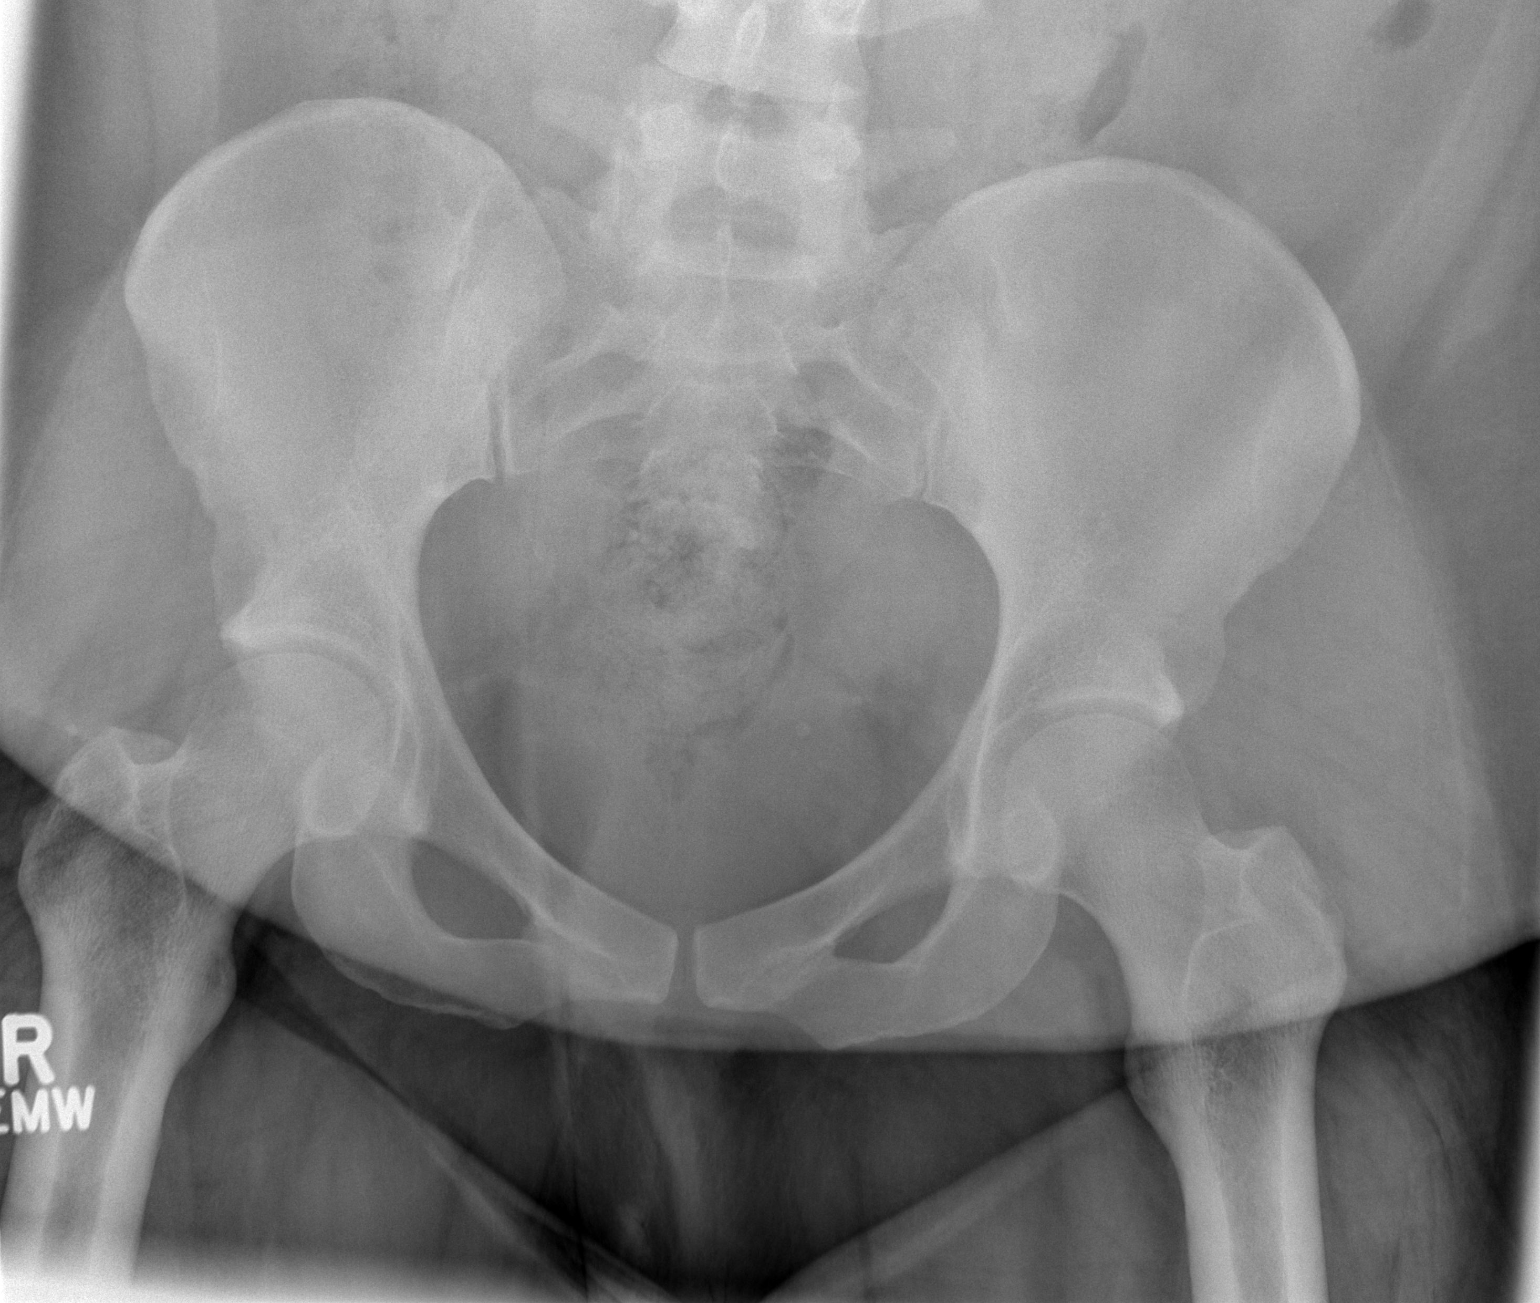

[t hip ap right]
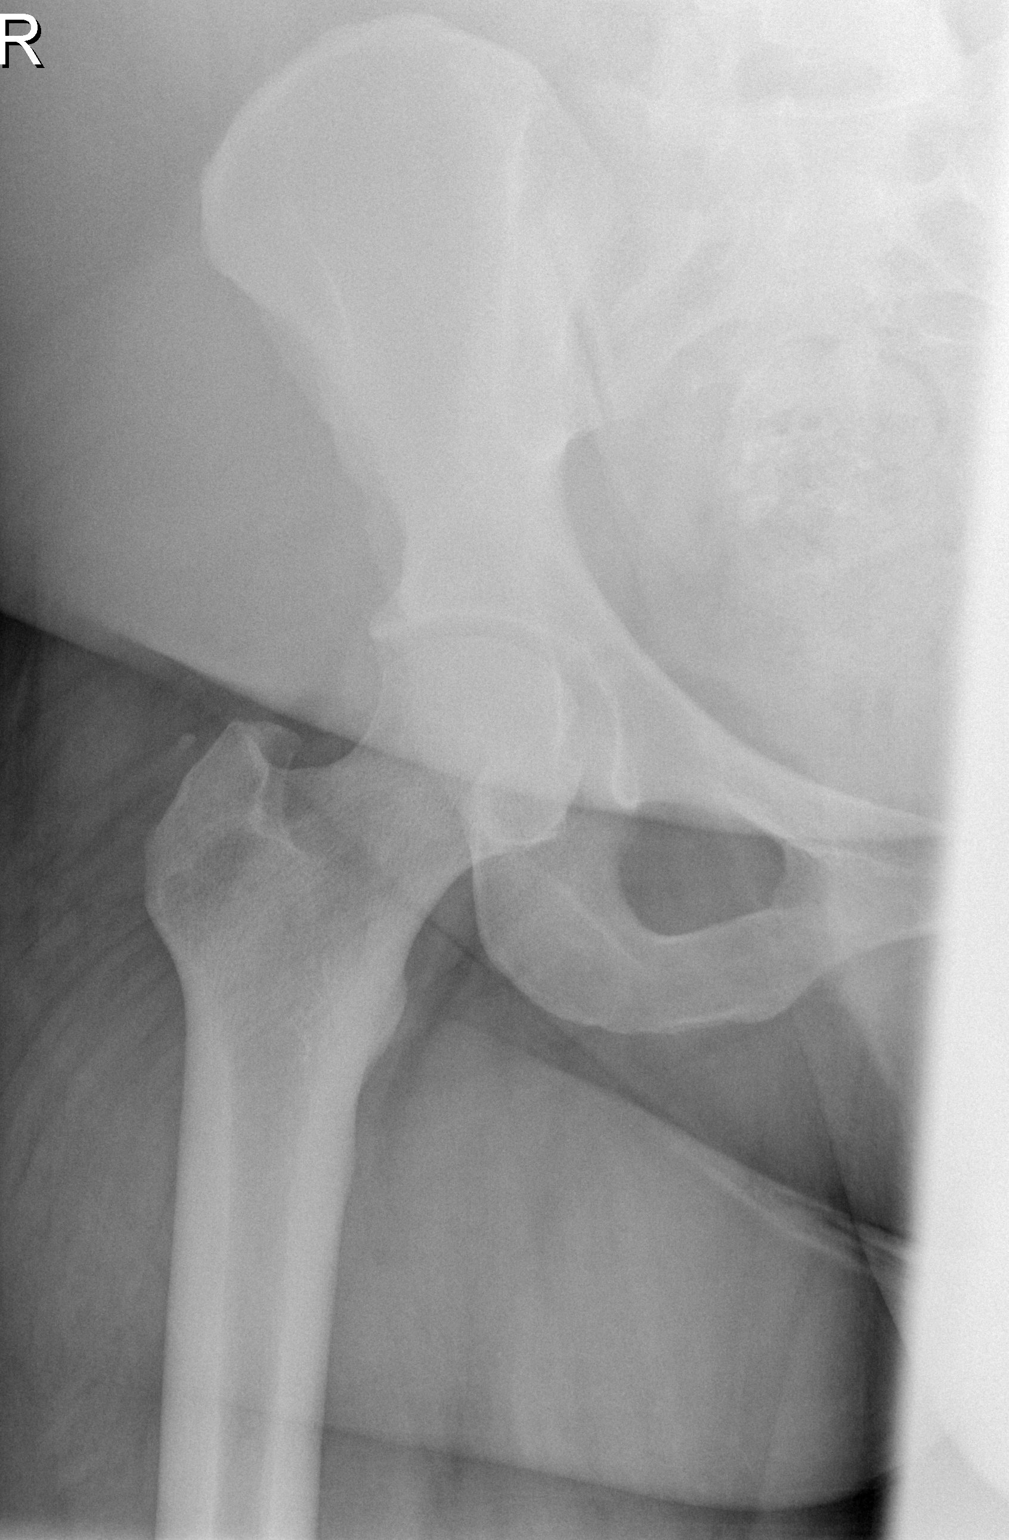

[t hip frog leg right]
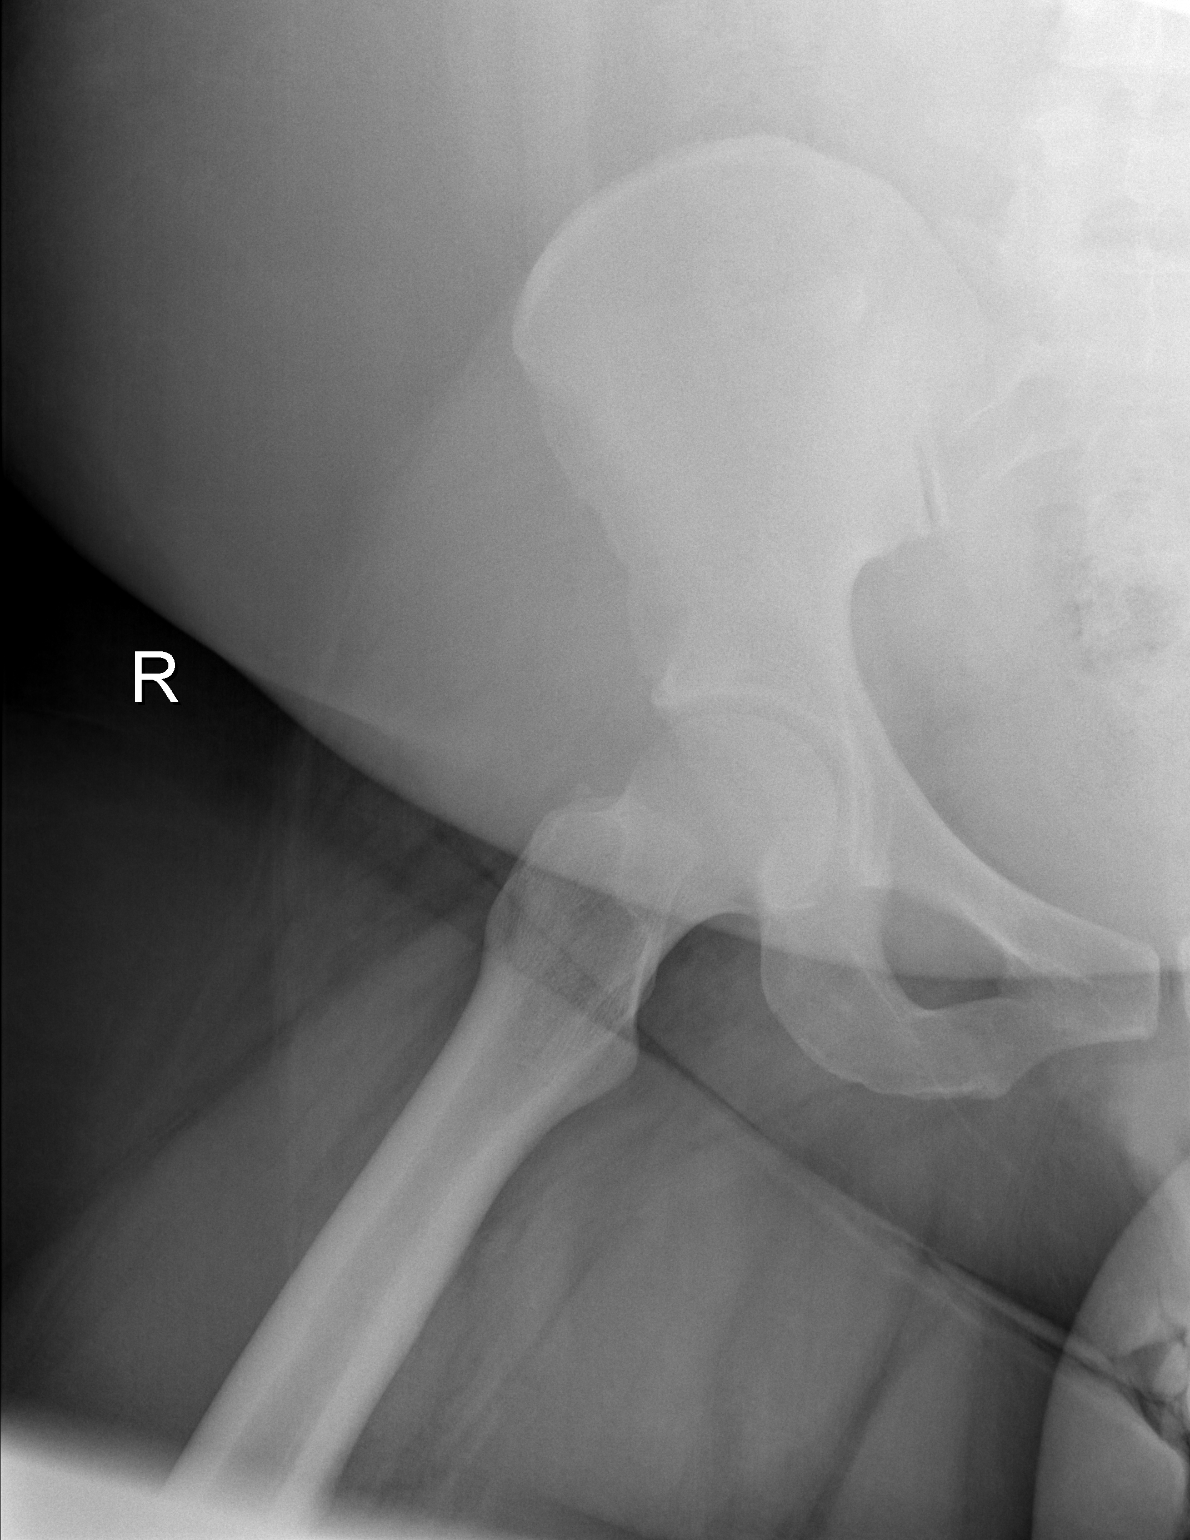

[3 of 3 positions shown; findings below may reference images not displayed]

FINDINGS: No fracture or dislocation.  No plain film evidence of
avascular necrosis.  No significant hip joint degenerative changes.
IMPRESSION: Negative plain film examination of the right hip.

## 2013-03-31 ENCOUNTER — Emergency Department (HOSPITAL_COMMUNITY)
Admission: EM | Admit: 2013-03-31 | Discharge: 2013-03-31 | Disposition: A | Discharge status: Home / Self Care | Payer: 59 | Attending: Emergency Medicine | Admitting: Emergency Medicine

## 2013-03-31 ENCOUNTER — Encounter (HOSPITAL_COMMUNITY): Payer: Self-pay | Admitting: Emergency Medicine

## 2013-03-31 DIAGNOSIS — W2209XA Striking against other stationary object, initial encounter: Secondary | ICD-10-CM | POA: Insufficient documentation

## 2013-03-31 DIAGNOSIS — S0121XA Laceration without foreign body of nose, initial encounter: Secondary | ICD-10-CM

## 2013-03-31 DIAGNOSIS — E669 Obesity, unspecified: Secondary | ICD-10-CM | POA: Insufficient documentation

## 2013-03-31 DIAGNOSIS — S0083XA Contusion of other part of head, initial encounter: Secondary | ICD-10-CM

## 2013-03-31 DIAGNOSIS — Y9301 Activity, walking, marching and hiking: Secondary | ICD-10-CM | POA: Insufficient documentation

## 2013-03-31 DIAGNOSIS — S0003XA Contusion of scalp, initial encounter: Secondary | ICD-10-CM | POA: Insufficient documentation

## 2013-03-31 DIAGNOSIS — N189 Chronic kidney disease, unspecified: Secondary | ICD-10-CM | POA: Insufficient documentation

## 2013-03-31 DIAGNOSIS — Z880 Allergy status to penicillin: Secondary | ICD-10-CM | POA: Insufficient documentation

## 2013-03-31 DIAGNOSIS — F3289 Other specified depressive episodes: Secondary | ICD-10-CM | POA: Insufficient documentation

## 2013-03-31 DIAGNOSIS — F411 Generalized anxiety disorder: Secondary | ICD-10-CM | POA: Insufficient documentation

## 2013-03-31 DIAGNOSIS — Z79899 Other long term (current) drug therapy: Secondary | ICD-10-CM | POA: Insufficient documentation

## 2013-03-31 DIAGNOSIS — Y9289 Other specified places as the place of occurrence of the external cause: Secondary | ICD-10-CM | POA: Insufficient documentation

## 2013-03-31 DIAGNOSIS — F329 Major depressive disorder, single episode, unspecified: Secondary | ICD-10-CM | POA: Insufficient documentation

## 2013-03-31 DIAGNOSIS — S0120XA Unspecified open wound of nose, initial encounter: Secondary | ICD-10-CM | POA: Insufficient documentation

## 2013-03-31 DIAGNOSIS — I129 Hypertensive chronic kidney disease with stage 1 through stage 4 chronic kidney disease, or unspecified chronic kidney disease: Secondary | ICD-10-CM | POA: Insufficient documentation

## 2013-03-31 MED ORDER — HYDROCODONE-ACETAMINOPHEN 5-325 MG PO TABS: 2.0000 | ORAL_TABLET | ORAL | Status: DC | PRN

## 2013-03-31 MED ORDER — ACETAMINOPHEN 325 MG PO TABS
650.0000 mg | ORAL_TABLET | Freq: Once | ORAL | Status: AC
  Administered 2013-03-31: 650 mg via ORAL
  Filled 2013-03-31: qty 2

## 2013-03-31 NOTE — ED Notes (Signed)
Patient is alert and oriented x3.  She is complaining of a laceration to the right nostril after walking  Into a car port pole.  Currently she rates her pain 8 of 10.  Bleeding is controlled from the laceration

## 2013-03-31 NOTE — ED Provider Notes (Signed)
CSN: [id number]     Arrival date & time 03/31/13  2119 History   First MD Initiated Contact with Patient 03/31/13 2143     Chief Complaint  Patient presents with  . Facial Laceration    HPI  Pt walked into a metal support for a carport in a dark driveway that was unfamiliar to her.  States that the contact sounded "like someone rung a bell". Has tenderness to right eyebrow. Small laceration to nasal ala. No loc.  Dizzy initially.  No Loc.  Did not fall to ground or have 2nd impact to head.   Past Medical History  Diagnosis Date  . Obesity   . HTN (hypertension)   . Obesity   . Depression   . Chronic kidney disease 12/03/2011    Hx of elevated protein in urine.  . Anxiety   . Shortness of breath     with exertion   History reviewed. No pertinent past surgical history. Family History  Problem Relation Age of Onset  . Bipolar disorder Mother   . Hypertension Mother   . Stroke Mother   . Anxiety disorder Sister   . Hypertension Sister   . Hypertension Father   . Cancer Maternal Aunt     breast  . Hypertension Maternal Aunt   . Hypertension Paternal Aunt   . Cancer Maternal Grandmother     lung  . Stroke Maternal Grandfather   . Cancer Paternal Grandmother     reast  . Stroke Paternal Grandmother    History  Substance Use Topics  . Smoking status: Never Smoker   . Smokeless tobacco: Never Used  . Alcohol Use: No   OB History   Grav Para Term Preterm Abortions TAB SAB Ect Mult Living                 Review of Systems  Constitutional: Negative for fever, chills, diaphoresis, appetite change and fatigue.  HENT: Negative for mouth sores, sore throat and trouble swallowing.   Eyes: Negative for visual disturbance.  Respiratory: Negative for cough, chest tightness, shortness of breath and wheezing.   Cardiovascular: Negative for chest pain.  Gastrointestinal: Negative for nausea, vomiting, abdominal pain, diarrhea and abdominal distention.  Endocrine: Negative for  polydipsia, polyphagia and polyuria.  Genitourinary: Negative for dysuria, frequency and hematuria.  Musculoskeletal: Negative for gait problem.  Skin: Positive for wound. Negative for color change, pallor and rash.  Neurological: Positive for headaches. Negative for dizziness, syncope and light-headedness.  Hematological: Does not bruise/bleed easily.  Psychiatric/Behavioral: Negative for behavioral problems and confusion.    Allergies  Amoxicillin and Penicillins  Home Medications   Current Outpatient Rx  Name  Route  Sig  Dispense  Refill  . citalopram (CELEXA) 20 MG tablet   Oral   Take 20 mg by mouth daily.         Marland Kitchen ibuprofen (ADVIL,MOTRIN) 200 MG tablet   Oral   Take 200 mg by mouth every 6 (six) hours as needed (pain).         Marland Kitchen lisinopril-hydrochlorothiazide (PRINZIDE,ZESTORETIC) 10-12.5 MG per tablet   Oral   Take 1 tablet by mouth 2 (two) times daily.         Marland Kitchen HYDROcodone-acetaminophen (NORCO/VICODIN) 5-325 MG per tablet   Oral   Take 2 tablets by mouth every 4 (four) hours as needed.   6 tablet   0    BP 155/111  Pulse 79  Temp(Src) 98.5 F (36.9 C) (Oral)  Resp 16  SpO2 100%  LMP 03/17/2013 Physical Exam  Constitutional: She is oriented to person, place, and time. She appears well-developed and well-nourished. No distress.  HENT:  Head: Normocephalic. Head is with laceration.    Nose:    Eyes: Conjunctivae are normal. Pupils are equal, round, and reactive to light. No scleral icterus.  No periorbital ecymosis.  No proptosis.  Normal V1-V3 sensation bilaterally.  Neck: Normal range of motion. Neck supple. No thyromegaly present.  Cardiovascular: Normal rate and regular rhythm.  Exam reveals no gallop and no friction rub.   No murmur heard. Pulmonary/Chest: Effort normal and breath sounds normal. No respiratory distress. She has no wheezes. She has no rales.  Abdominal: Soft. Bowel sounds are normal. She exhibits no distension. There is no  tenderness. There is no rebound.  Musculoskeletal: Normal range of motion.  Neurological: She is alert and oriented to person, place, and time.  Skin: Skin is warm and dry. No rash noted.  Psychiatric: She has a normal mood and affect. Her behavior is normal.    ED Course  Procedures (including critical care time) Labs Review Labs Reviewed - No data to display Imaging Review No results found.  EKG Interpretation   None       MDM   1. Nasal laceration, initial encounter   2. Forehead contusion, initial encounter    Minimal nasal ala lac that does not require repair.  Discussed with patient.    Roney Marion, MD 03/31/13 2223

## 2013-08-23 ENCOUNTER — Other Ambulatory Visit (HOSPITAL_COMMUNITY): Payer: 59 | Attending: Psychiatry | Admitting: Psychiatry

## 2013-08-24 ENCOUNTER — Other Ambulatory Visit (HOSPITAL_COMMUNITY): Payer: 59

## 2013-08-27 ENCOUNTER — Other Ambulatory Visit (HOSPITAL_COMMUNITY): Payer: 59 | Attending: Psychiatry | Admitting: Psychology

## 2013-08-27 ENCOUNTER — Encounter (HOSPITAL_COMMUNITY): Payer: Self-pay | Admitting: Psychology

## 2013-08-27 ENCOUNTER — Other Ambulatory Visit (HOSPITAL_COMMUNITY): Payer: 59

## 2013-08-27 DIAGNOSIS — F1320 Sedative, hypnotic or anxiolytic dependence, uncomplicated: Secondary | ICD-10-CM | POA: Insufficient documentation

## 2013-08-27 DIAGNOSIS — F329 Major depressive disorder, single episode, unspecified: Secondary | ICD-10-CM | POA: Insufficient documentation

## 2013-08-27 DIAGNOSIS — F1220 Cannabis dependence, uncomplicated: Secondary | ICD-10-CM

## 2013-08-27 DIAGNOSIS — F411 Generalized anxiety disorder: Secondary | ICD-10-CM | POA: Insufficient documentation

## 2013-08-27 DIAGNOSIS — F17200 Nicotine dependence, unspecified, uncomplicated: Secondary | ICD-10-CM | POA: Insufficient documentation

## 2013-08-27 DIAGNOSIS — F4310 Post-traumatic stress disorder, unspecified: Secondary | ICD-10-CM | POA: Insufficient documentation

## 2013-08-27 DIAGNOSIS — I10 Essential (primary) hypertension: Secondary | ICD-10-CM | POA: Insufficient documentation

## 2013-08-27 DIAGNOSIS — F3289 Other specified depressive episodes: Secondary | ICD-10-CM | POA: Insufficient documentation

## 2013-08-27 DIAGNOSIS — F410 Panic disorder [episodic paroxysmal anxiety] without agoraphobia: Secondary | ICD-10-CM | POA: Insufficient documentation

## 2013-08-28 ENCOUNTER — Encounter (HOSPITAL_COMMUNITY): Payer: Self-pay | Admitting: Psychology

## 2013-08-28 ENCOUNTER — Other Ambulatory Visit (HOSPITAL_COMMUNITY): Payer: 59

## 2013-08-28 NOTE — Progress Notes (Signed)
    Daily Group Progress Note  Program: CD-IOP   Group Time: 1-2:30 pm  Participation Level: Minimal  Behavioral Response: Appropriate  Type of Therapy: Psycho-education Group  Topic: Psycho-Ed: the first part of group was spent in a psycho-ed session with a [facility] pharmacist, EP. She offered a presentation on the various categories of drugs. The pharmacist explained the effects of the drugs on one's brain and the subsequent withdrawal symptoms that are experienced in early recovery. There were many questions from group members and the session was very lively and proved to be a very informative session for the "laymen's" in the program. The group agreed that the presentation was very helpful.  Group Time: 2:45-4pm  Participation Level: Minimal  Behavioral Response: Appropriate and Sharing  Type of Therapy: Process Group  Topic: Group Process: the second part of group was spent in process. Members shared about the past weekend and concerns and challenges that presented themselves. Members had varied experiences this weekend, but most had been active, but challenged. A new member was present today and she introduced herself to the group members. The group provided good feedback to the new member and her experiences were validated. There was good exchange and sharing among members.   Summary: The patient was new to the group. She checked in with a sobriety date of 4/19. The patient was quiet for most of the session with the pharmacist, but introduced herself to her new group members in the second half of group. The patient noted that she had only been smoking cannabis, but she admitted that her use had increased over the past two years. She now recognized that after hearing the other members sharing about their use, she realized that she was just like them. With this disclosure, the patient seemed to accept the seriousness of her addiction. She responded well to this first group session.      Family Program: Family present? No   Name of family member(s):   UDS collected: Yes Results: not back yet  AA/NA attended?: No, patient is new to program  Sponsor?: No   Nikole Swartzentruber, LCAS

## 2013-08-28 NOTE — Progress Notes (Unsigned)
Alexis Mccullough Orientation to CD-IOP: The patient is a 33 yo single, African-American, female referred to the program by Alexis Mccullough, the case worker here at [facility]. The patient had originally sought treatment in the Psych-IOP, but upon further disclosure of her drug use, she was recommended to the CD-IOP. [place] in [place] grandmother, [place] [employer]. The patient reported she had last smoked cannabis on Saturday and today she was feeling irritable and could barely remember how to get here to the clinic. She reported first using cannabis in her early teens, but her use, up until the last few years, was only occasional. She reported her fianc suffered from late-stage renal failure and used cannabis for medicinal purposes. Despite efforts to secure a new kidney, the patient died due to kidney failure approximately 2 years ago. The patient admitted that after his death, her drug use increased markedly. She noted that there was a large quantity of cannabis at their apartment that he had intended to use. The patient reported smoking 4-5 times every day for the past two years. She admitted she smokes in the morning before she leaves for work and is high for most of the day.  The patient reported she drinks a glass of wine 1-2 times every month. She has used cocaine, but only as a powder which she put in her blunts (a woozie). Her last cocaine use was a year ago. The patients is diagnosed as suffering from anxiety, depression, and ADD. She is prescribed Adderall and Klonipin by [name], a psychiatrist [facility], but admitted she hasn't taken either of them and has lots of pills at home. The patient stated they aren't effective and she has never abused them. The patient reported she has stopped taking everything, including her Celexa, with the exception of her blood pressure medications. She reported she had missed her last appointment with  Akintayo and had run out of refills. I explained that she would meet with the program director on Wednesday and he would assist in prescribing appropriate medications. The paternal side of the family is where the addiction seems to reside. The patient's father was a heavy drinker, but stopped entirely and entered the church. Her paternal aunt is a crack cocaine addict and another aunt died from complications of her addiction at age 33. The patient's mother lives in a 'mental institution" and has been since she was 33 yo. Her mother has MS and developed lesions in her brain as a result of the multiple sclerosis. The dementia and damage was such that she grew unable to care for herself and was institutionalized. The patient admitted she has been shut down for a long time and never grieved the absence and loss of her mother or her fianc. Although resistant at first, the patient gradually admitted that she needed this program and that her drug use was more of a problem than she had realized. The documentation was reviewed, signed and the orientation completed accordingly. She will return at 1 pm this afternoon to begin the CD-IOP.         [name], LCAS

## 2013-08-29 ENCOUNTER — Other Ambulatory Visit (HOSPITAL_COMMUNITY): Payer: 59 | Admitting: Psychology

## 2013-08-29 ENCOUNTER — Other Ambulatory Visit (HOSPITAL_COMMUNITY): Payer: 59

## 2013-08-29 DIAGNOSIS — F4310 Post-traumatic stress disorder, unspecified: Secondary | ICD-10-CM | POA: Insufficient documentation

## 2013-08-29 DIAGNOSIS — F329 Major depressive disorder, single episode, unspecified: Secondary | ICD-10-CM | POA: Insufficient documentation

## 2013-08-29 DIAGNOSIS — F515 Nightmare disorder: Secondary | ICD-10-CM | POA: Insufficient documentation

## 2013-08-29 DIAGNOSIS — F32A Depression, unspecified: Secondary | ICD-10-CM | POA: Insufficient documentation

## 2013-08-29 DIAGNOSIS — F4010 Social phobia, unspecified: Secondary | ICD-10-CM | POA: Insufficient documentation

## 2013-08-29 DIAGNOSIS — F1321 Sedative, hypnotic or anxiolytic dependence, in remission: Secondary | ICD-10-CM

## 2013-08-29 DIAGNOSIS — Z818 Family history of other mental and behavioral disorders: Secondary | ICD-10-CM | POA: Insufficient documentation

## 2013-08-29 DIAGNOSIS — F909 Attention-deficit hyperactivity disorder, unspecified type: Secondary | ICD-10-CM

## 2013-08-29 DIAGNOSIS — Z813 Family history of other psychoactive substance abuse and dependence: Secondary | ICD-10-CM | POA: Insufficient documentation

## 2013-08-29 DIAGNOSIS — F0630 Mood disorder due to known physiological condition, unspecified: Secondary | ICD-10-CM

## 2013-08-29 DIAGNOSIS — F1994 Other psychoactive substance use, unspecified with psychoactive substance-induced mood disorder: Secondary | ICD-10-CM | POA: Insufficient documentation

## 2013-08-29 DIAGNOSIS — F4312 Post-traumatic stress disorder, chronic: Secondary | ICD-10-CM | POA: Insufficient documentation

## 2013-08-29 DIAGNOSIS — F1220 Cannabis dependence, uncomplicated: Secondary | ICD-10-CM

## 2013-08-29 DIAGNOSIS — T7422XA Child sexual abuse, confirmed, initial encounter: Secondary | ICD-10-CM | POA: Insufficient documentation

## 2013-08-29 HISTORY — DX: Attention-deficit hyperactivity disorder, unspecified type: F90.9

## 2013-08-29 HISTORY — DX: Sedative, hypnotic or anxiolytic dependence, in remission: F13.21

## 2013-08-29 HISTORY — DX: Family history of other psychoactive substance abuse and dependence: Z81.3

## 2013-08-29 MED ORDER — CITALOPRAM HYDROBROMIDE 20 MG PO TABS: 20.0000 mg | ORAL_TABLET | Freq: Two times a day (BID) | ORAL | Status: DC

## 2013-08-29 MED ORDER — CLONIDINE HCL 0.1 MG PO TABS: 0.1000 mg | ORAL_TABLET | Freq: Two times a day (BID) | ORAL | Status: DC

## 2013-08-29 MED ORDER — HYDROCHLOROTHIAZIDE 25 MG PO TABS: 25.0000 mg | ORAL_TABLET | Freq: Every day | ORAL | Status: DC
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
## 2013-08-30 ENCOUNTER — Other Ambulatory Visit (HOSPITAL_COMMUNITY): Payer: 59

## 2013-08-30 NOTE — Progress Notes (Unsigned)
    Daily Group Progress Note  Program: CD-IOP   Group Time: 1:00-2:30pm  Participation Level: Active  Behavioral Response: Appropriate  Type of Therapy: Process Group  Topic: The first half of group was spent with a group speaker, who shared his recovery story with the group.  He explained the effects that drug use had on his life for many years, and then described his process of recovery and the aspects of recovery that were most important to him.  The counselor also encouraged him to share about the importance of sponsorship, which he did. The group was very appreciative of his story and many group members reported being very inspired by him.     Group Time: 2:45-4:00pm  Participation Level: Active  Behavioral Response: Appropriate  Type of Therapy: Psycho-education Group  Topic: The second half of group was spent discussing the story shared by the guest speaker.  Group members shared their reactions to his story and spent time discussing the reasons that having a sponsor was important to them.  They shared about the various requirements their sponsors have for them and discussed the process of choosing a sponsor.  The final half hour of group was spent in process, and several group members shared about how they were doing in their recovery.   Summary:Patient reported a sobriety date of 4/19.  She met with the program director during the first half of group.  In the second half, she shared that she was feeling guilty about being tired, and that her family did not understand why she was tired since she is taking time off from work.  She expressed concern about telling friends and acquaintances about why she is taking time off to get treatment, and received feedback from several group members.  She reported that she was "in a weird place" but was glad to be in group.   Family Program: Family present? No   Name of family member(s): n/a   UDS collected: No Results: n/a  AA/NA  attended?: No  Sponsor?: No   [facility]

## 2013-08-31 ENCOUNTER — Other Ambulatory Visit (HOSPITAL_COMMUNITY): Payer: 59 | Admitting: Psychology

## 2013-08-31 ENCOUNTER — Other Ambulatory Visit (HOSPITAL_COMMUNITY): Payer: 59

## 2013-08-31 DIAGNOSIS — F1220 Cannabis dependence, uncomplicated: Secondary | ICD-10-CM

## 2013-09-01 ENCOUNTER — Encounter (HOSPITAL_COMMUNITY): Payer: Self-pay

## 2013-09-02 ENCOUNTER — Encounter (HOSPITAL_COMMUNITY): Payer: Self-pay | Admitting: Psychology

## 2013-09-02 NOTE — Progress Notes (Signed)
    Daily Group Progress Note  Program: CD-IOP   Group Time: 1-2:30 pm  Participation Level: Active  Behavioral Response: Appropriate and Sharing  Type of Therapy: Process Group  Topic: Process/Guided Relaxation: the first part of group was spent in process. Members shared about current issues and concerns. Two new group members were present and they introduced themselves during this part of group. They received good feedback and support from their new group members.  Group Time: 2:45- 4pm  Participation Level: Active  Behavioral Response: Appropriate and Sharing  Type of Therapy: Psycho-education Group  Topic: Wheel of Life: the second half of group was spent in a psycho-ed using the Wheel of Life handout. Group members were provided with a handout with a circle in the center. The circle included 8 segments, each of which represented different important elements of life.  The handout required members to identify how satisfied or fulfilled they are in each category. The exercise allows the participant to see how his/her wheel looks relative to balance as well as challenging the person to describe their wheel to their fellow group members. The session ended with a graduation ceremony and [facility] the counseling intern who has been here in the CD-IOP since August. It was an emotional, but kindhearted farewell.   Summary: The patient arrived late. She apologized and explained she had had car problems and almost decided not to come. The patient admitted she had envisioned one of her fellow group member's face and she had recognized she needed to be here. The group applauded her efforts and presence here today and this provided me an opportunity to emphasized the importance of attendance and how much the group is altered by someone's absence. The patient reported she had attended an NA meeting, but she felt as if her identity was compromised and she felt uncomfortable so she left the  meeting. She stated she would probably attend some different meetings and even some [place] in [place] of [place]. The patient drew her wheel. It was also imbalanced with the only really strong part of the 8 pieces being her career. She is unfulfilled or disatisfied with each of the other categories of her life.  The patient laughed as she described her frustration with her weight as well as other parts of her appearance. The group applauded her openness and she dispalyed a willingness to be vulnerable by her disclosures. She made some good comments and responded well to this intervention. Her sobriety date is 4/19.  Family Program: Family present? No   Name of family member(s):   UDS collected: No Results:   AA/NA attended?: YesWednesday  Sponsor?: No   Deontay Ladnier, LCAS

## 2013-09-03 ENCOUNTER — Other Ambulatory Visit (HOSPITAL_COMMUNITY): Payer: 59

## 2013-09-04 ENCOUNTER — Other Ambulatory Visit (HOSPITAL_COMMUNITY): Payer: 59

## 2013-09-04 ENCOUNTER — Telehealth (HOSPITAL_COMMUNITY): Payer: Self-pay | Admitting: Psychology

## 2013-09-05 ENCOUNTER — Other Ambulatory Visit (HOSPITAL_COMMUNITY): Payer: 59 | Admitting: Psychology

## 2013-09-05 ENCOUNTER — Other Ambulatory Visit (HOSPITAL_COMMUNITY): Payer: 59

## 2013-09-05 MED ORDER — TRAZODONE HCL 50 MG PO TABS: 50.0000 mg | ORAL_TABLET | Freq: Every day | ORAL | Status: DC

## 2013-09-05 NOTE — Progress Notes (Unsigned)
Patient ID: Alexis Mccullough, female   DOB: 09-11-1980, 33 y.o.   MRN: [id number] S-Pt in for medication check and c/o insomnia .Has w/d protocol for her klonopin which was reviewed.Money is tight and she couldnt recall what rx were sent last week.She admits she relapsed this weekend dueto"stress" and being triggerd by joint she found cleaning her purse.She has had success with trazodone for sleep on death of fiancee.Feels like she wants to start back on her amphetamine to give her energy ''GET UP AND GO"  O-Rx for celexa/clonidine and HCTZ sent last week-pt informed.     Pt is alert /oriented/somewhat down-sad over relapse/speech and thought are normal and comprehensible . Language and Motor function are normal  A-Polysubstance dependence-relapse in 1st week of treatment.Insomnia  P Klonopin Taper given to pt in writing     No amphetamine for stimulation-give body a chance to heal and provide energy     Rx trazodone for sleep     Encouragement to continue

## 2013-09-06 ENCOUNTER — Other Ambulatory Visit (HOSPITAL_COMMUNITY): Payer: 59

## 2013-09-06 ENCOUNTER — Telehealth (HOSPITAL_COMMUNITY): Payer: Self-pay | Admitting: Psychology

## 2013-09-07 ENCOUNTER — Other Ambulatory Visit (HOSPITAL_COMMUNITY): Payer: 59 | Attending: Psychiatry | Admitting: Psychology

## 2013-09-07 ENCOUNTER — Other Ambulatory Visit (HOSPITAL_COMMUNITY): Payer: 59

## 2013-09-07 DIAGNOSIS — F411 Generalized anxiety disorder: Secondary | ICD-10-CM | POA: Insufficient documentation

## 2013-09-07 DIAGNOSIS — F1320 Sedative, hypnotic or anxiolytic dependence, uncomplicated: Secondary | ICD-10-CM | POA: Insufficient documentation

## 2013-09-07 DIAGNOSIS — I10 Essential (primary) hypertension: Secondary | ICD-10-CM | POA: Insufficient documentation

## 2013-09-07 DIAGNOSIS — F3289 Other specified depressive episodes: Secondary | ICD-10-CM | POA: Insufficient documentation

## 2013-09-07 DIAGNOSIS — F329 Major depressive disorder, single episode, unspecified: Secondary | ICD-10-CM | POA: Insufficient documentation

## 2013-09-07 DIAGNOSIS — F4310 Post-traumatic stress disorder, unspecified: Secondary | ICD-10-CM | POA: Insufficient documentation

## 2013-09-07 DIAGNOSIS — F17200 Nicotine dependence, unspecified, uncomplicated: Secondary | ICD-10-CM | POA: Insufficient documentation

## 2013-09-07 DIAGNOSIS — F410 Panic disorder [episodic paroxysmal anxiety] without agoraphobia: Secondary | ICD-10-CM | POA: Insufficient documentation

## 2013-09-07 DIAGNOSIS — F1220 Cannabis dependence, uncomplicated: Secondary | ICD-10-CM

## 2013-09-10 ENCOUNTER — Other Ambulatory Visit (HOSPITAL_COMMUNITY): Payer: 59

## 2013-09-10 ENCOUNTER — Encounter (HOSPITAL_COMMUNITY): Payer: Self-pay | Admitting: Psychology

## 2013-09-10 ENCOUNTER — Other Ambulatory Visit (HOSPITAL_COMMUNITY): Payer: 59 | Admitting: Psychology

## 2013-09-10 DIAGNOSIS — F1220 Cannabis dependence, uncomplicated: Secondary | ICD-10-CM

## 2013-09-10 DIAGNOSIS — F4310 Post-traumatic stress disorder, unspecified: Secondary | ICD-10-CM
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
## 2013-09-11 ENCOUNTER — Other Ambulatory Visit (HOSPITAL_COMMUNITY): Payer: 59

## 2013-09-12 ENCOUNTER — Other Ambulatory Visit (HOSPITAL_COMMUNITY): Payer: 59

## 2013-09-12 ENCOUNTER — Other Ambulatory Visit (HOSPITAL_COMMUNITY): Payer: 59 | Admitting: Psychology

## 2013-09-12 DIAGNOSIS — F4310 Post-traumatic stress disorder, unspecified: Secondary | ICD-10-CM

## 2013-09-12 DIAGNOSIS — F1220 Cannabis dependence, uncomplicated: Secondary | ICD-10-CM

## 2013-09-13 ENCOUNTER — Other Ambulatory Visit (HOSPITAL_COMMUNITY): Payer: 59

## 2013-09-14 ENCOUNTER — Other Ambulatory Visit (HOSPITAL_COMMUNITY): Payer: 59 | Admitting: Psychology

## 2013-09-14 ENCOUNTER — Other Ambulatory Visit (HOSPITAL_COMMUNITY): Payer: 59

## 2013-09-14 DIAGNOSIS — F1220 Cannabis dependence, uncomplicated: Secondary | ICD-10-CM

## 2013-09-14 DIAGNOSIS — F4310 Post-traumatic stress disorder, unspecified: Secondary | ICD-10-CM

## 2013-09-15 ENCOUNTER — Encounter (HOSPITAL_COMMUNITY): Payer: Self-pay

## 2013-09-17 ENCOUNTER — Other Ambulatory Visit (HOSPITAL_COMMUNITY): Payer: 59 | Admitting: Psychology

## 2013-09-17 ENCOUNTER — Other Ambulatory Visit (HOSPITAL_COMMUNITY): Payer: 59

## 2013-09-17 DIAGNOSIS — F1220 Cannabis dependence, uncomplicated: Secondary | ICD-10-CM

## 2013-09-17 DIAGNOSIS — F4310 Post-traumatic stress disorder, unspecified: Secondary | ICD-10-CM

## 2013-09-18 ENCOUNTER — Other Ambulatory Visit (HOSPITAL_COMMUNITY): Payer: 59

## 2013-09-19 ENCOUNTER — Other Ambulatory Visit (HOSPITAL_COMMUNITY): Payer: 59

## 2013-09-19 ENCOUNTER — Encounter (HOSPITAL_COMMUNITY): Payer: Self-pay | Admitting: Psychology

## 2013-09-19 NOTE — Progress Notes (Signed)
    Daily Group Progress Note  Program: CD-IOP   Group Time: 1-2:30 pm  Participation Level: Active  Behavioral Response: Appropriate and Sharing  Type of Therapy: Process Group  Topic: Group Process: the first part off group was spent in process. Members shared about issues, concerns and events over this past weekend. One member described a successful weekend topped with his son telling him he was proud of him. Another member had a call to ED for her 33 yo mother. Everyone attended at least one 12-step meeting with some attending 3-4 meetings. There was good disclosure among members and good validation on recovery-related behaviors. Drug tests were collected from every member present.   Group Time: 2:45- 4pm  Participation Level: Active  Behavioral Response: Appropriate and Sharing  Type of Therapy: Psycho-education Group  Topic: "Dry Drunk": the second half of group was spent in a psycho-ed describing the term "dry drunk". A 2-page handout was provided and members took turns to read the descriptions. The session was effective in identifying the characteristics that occur when one goes from a good recovery program gradually into relapse mentality and, subsequent, relapse. There was a good discussion with those who had relapsed in the past providing examples of how their mentality changed. At the conclusion of the session, a graduation ceremony was held for a member who was successfully leaving the program.   Summary: The patient reported she had had a good weekend. She had attended a meeting and it was pretty good. The patient reported she had realized a lot of things over the weekend with her family and the connections and problems she has had. The patient agreed that she had no idea how difficult and challenging sobriety was going to be. The patient made some good comments and had kind words for the graduating member. Her sobriety date is 4/26.    Family Program: Family present? No    Name of family member(s):   UDS collected: No Results:  AA/NA attended?: YesSaturday  Sponsor?: No   Tina Gruner, LCAS

## 2013-09-20 ENCOUNTER — Other Ambulatory Visit (HOSPITAL_COMMUNITY): Payer: 59

## 2013-09-21 ENCOUNTER — Ambulatory Visit (HOSPITAL_COMMUNITY): Payer: Self-pay | Admitting: Psychiatry

## 2013-09-21 ENCOUNTER — Other Ambulatory Visit (HOSPITAL_COMMUNITY): Payer: 59 | Admitting: Psychology

## 2013-09-21 DIAGNOSIS — F1220 Cannabis dependence, uncomplicated: Secondary | ICD-10-CM

## 2013-09-21 DIAGNOSIS — F4310 Post-traumatic stress disorder, unspecified: Secondary | ICD-10-CM

## 2013-09-23 ENCOUNTER — Encounter (HOSPITAL_COMMUNITY): Payer: Self-pay | Admitting: Psychology

## 2013-09-23 NOTE — Progress Notes (Signed)
    Daily Group Progress Note  Program: CD-IOP   Group Time: 1-2:30 pm  Participation Level: Active  Behavioral Response: Appropriate and Sharing  Type of Therapy: Process Group  Topic: Group Process: the first part of group was spent in process. Members began the session with a check-in followed by a 5 minute breathing/meditation session. Members were then invited to share about current issues and concerns. One member seemed to be crying and the group invited her to share. She disclosed the events earlier today with her as of today, 33 yo daughter. She expressed frustration and fears about her daughter's tendency to overreact and get upset at minor things. The group processed this event and behavior and she received good feedback. A new member was present and he shared a little about himself. At only 33 years of age, his new group members were in awe that he was ready and accepting treatment for his chemical dependency. The first session included good sharing and disclosures among the group. During this time the program director was meeting patients for discharge, medication follow-up, and an initial session with a new member.  Group Time: 2:45- 4pm  Participation Level: Active  Behavioral Response: Sharing  Type of Therapy: Psycho-education Group  Topic: Psycho-Ed/Graduation: the second half of group was spent in a psycho-ed about the developmental process of recovery. Members were provided a handout and they read it together. It identified the difference feelings as well as tasks during the first 18 months of recovery. Members who had achieved previous periods of sobriety shared their own experiences while newer members to recovery identified the "pink cloud", 'reality' and other earlier recovery experiences. At the conclusion of the session, a graduation ceremony was held. Members shared kind words and hopes with the graduating member and she provided wonderful and encouraging words for  the program and people she is leaving.   Summary: The patient reported she had been very tempted last night with her sisters, but made a phone call and received good support from a fellow group members. She had not drank or drugged and was very happy today when she awoke still sober. She thanked the member because he was here in group. The patient admitted it had been very helpful and she was not beginning to understand how this works. The patient also noted she had attended the 10 am this morning and met all kinds of women - including a 33 yo Caucasian lady who this patient figured would be 'scared' of her. Instead she was warmly welcomed. The patient was funny and deprecating throughout the group. She admitted during the graduation that she never felt she could feel close to the graduating member because they were so different. But now, she explained, she realized that they aren't so different at all. Her sobriety date remains 4/26 and a drug test was collected today.   Family Program: Family present? No   Name of family member(s):   UDS collected: Yes Results: not returned from the lab  AA/NA attended?: YesFriday  Sponsor?: No   Lowen Barringer, LCAS

## 2013-09-24 ENCOUNTER — Other Ambulatory Visit (HOSPITAL_COMMUNITY): Payer: 59

## 2013-09-25 ENCOUNTER — Other Ambulatory Visit (HOSPITAL_COMMUNITY): Payer: 59

## 2013-09-26 ENCOUNTER — Other Ambulatory Visit (HOSPITAL_COMMUNITY): Payer: 59 | Admitting: Psychology

## 2013-09-26 ENCOUNTER — Other Ambulatory Visit (HOSPITAL_COMMUNITY): Payer: 59

## 2013-09-26 DIAGNOSIS — F4310 Post-traumatic stress disorder, unspecified: Secondary | ICD-10-CM

## 2013-09-26 DIAGNOSIS — F1220 Cannabis dependence, uncomplicated: Secondary | ICD-10-CM

## 2013-09-27 ENCOUNTER — Encounter (HOSPITAL_COMMUNITY): Payer: Self-pay | Admitting: Psychology

## 2013-09-27 ENCOUNTER — Other Ambulatory Visit (HOSPITAL_COMMUNITY): Payer: 59
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
## 2013-09-28 ENCOUNTER — Encounter (HOSPITAL_COMMUNITY): Payer: Self-pay | Admitting: Psychology

## 2013-09-28 ENCOUNTER — Other Ambulatory Visit (HOSPITAL_COMMUNITY): Payer: 59

## 2013-09-28 ENCOUNTER — Other Ambulatory Visit (HOSPITAL_COMMUNITY): Payer: 59 | Admitting: Psychology

## 2013-10-02 ENCOUNTER — Encounter (HOSPITAL_COMMUNITY): Payer: Self-pay | Admitting: Psychology

## 2013-10-02 ENCOUNTER — Other Ambulatory Visit (HOSPITAL_COMMUNITY): Payer: 59

## 2013-10-02 NOTE — Progress Notes (Signed)
    Daily Group Progress Note  Program: CD-IOP   Group Time: 1-2:30 pm  Participation Level: Active  Behavioral Response: Sharing  Type of Therapy: Process Group  Topic: Group process; the first part of group was spent in process. Members shared about issues and concerns in early recovery. Issues included sleep problems, frustrations with family, and making the necessary changes in all aspects of one's life. One member had been absent on Monday and she apologized for not calling. 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 This generated a discussion on addressing addiction and ensuring that one just isn't transferring it to another chemical or behavior. There was good discussion and feedback among group members.  Group Time: 2:45- 4pm  Participation Level: Active  Behavioral Response: Sharing  Type of Therapy: Psycho-education Group  Topic: Denial/Graduation: the second half of group was spent in a psycho-ed on Denial. A handout had been provided on Monday and members offered comments about what they had learned. Members displayed some good insight into denial, using examples from their own lives. As the session neared an end, the group observed a graduation ceremony. An active and well-respected member was leaving today and she received good support and validation as she moves into another phase of this lifestyle.   Summary: the patient appeared today after having missed the group session on Monday. She apologized for not having responded to text messages from her fellow group members. The patient admitted that she had been very wreckless over the weekend and was substituting sex for her drug addiction. She had had sex with someone she knew, but immediately regretted the decision. The patient's comments generated some discussion on the tendency among people in early recovery to just substitute using something else for their primary drug of  addiction. She also admitted she is wondering what her mental health diagnosis really is because she catches herself being moody or depressed easily. The patient made some good comments in the dixcussion on denial and was able to provide examples of both her own denial and family members denial as well. The patient continues to struggle with lack of finances as well as a very dysfunctional family who offer support but seem unwilling to back it up with actual assistance. The patient provided kind words os support to the graduating member. The patient's life is so chaotic in so many ways. I reminded her that if she can't focus on recovery, she would be best served just dropping out of the program. The patient articulated her motivation to address her addiction now. Her sobriety date is 4/26.   Family Program: Family present? No   Name of family member(s):   UDS collected: Yes Results: not back yet  AA/NA attended?: YesTuesday  Sponsor?: No   Brylei Pedley, LCAS

## 2013-10-03 ENCOUNTER — Other Ambulatory Visit (HOSPITAL_COMMUNITY): Payer: 59 | Admitting: Psychology

## 2013-10-03 ENCOUNTER — Other Ambulatory Visit (HOSPITAL_COMMUNITY): Payer: 59

## 2013-10-04 ENCOUNTER — Other Ambulatory Visit (HOSPITAL_COMMUNITY): Payer: 59

## 2013-10-05 ENCOUNTER — Other Ambulatory Visit (HOSPITAL_COMMUNITY): Payer: 59

## 2013-10-05 ENCOUNTER — Other Ambulatory Visit (HOSPITAL_COMMUNITY): Payer: 59 | Admitting: Psychology

## 2013-10-05 DIAGNOSIS — F1220 Cannabis dependence, uncomplicated: Secondary | ICD-10-CM

## 2013-10-05 DIAGNOSIS — F4310 Post-traumatic stress disorder, unspecified: Secondary | ICD-10-CM

## 2013-10-06 ENCOUNTER — Encounter (HOSPITAL_BASED_OUTPATIENT_CLINIC_OR_DEPARTMENT_OTHER): Payer: Self-pay | Admitting: Emergency Medicine

## 2013-10-06 DIAGNOSIS — Y939 Activity, unspecified: Secondary | ICD-10-CM | POA: Insufficient documentation

## 2013-10-06 DIAGNOSIS — T171XXA Foreign body in nostril, initial encounter: Secondary | ICD-10-CM | POA: Insufficient documentation

## 2013-10-06 DIAGNOSIS — Z880 Allergy status to penicillin: Secondary | ICD-10-CM | POA: Insufficient documentation

## 2013-10-06 DIAGNOSIS — E669 Obesity, unspecified: Secondary | ICD-10-CM | POA: Insufficient documentation

## 2013-10-06 DIAGNOSIS — F3289 Other specified depressive episodes: Secondary | ICD-10-CM | POA: Insufficient documentation

## 2013-10-06 DIAGNOSIS — Y929 Unspecified place or not applicable: Secondary | ICD-10-CM | POA: Insufficient documentation

## 2013-10-06 DIAGNOSIS — F329 Major depressive disorder, single episode, unspecified: Secondary | ICD-10-CM | POA: Insufficient documentation

## 2013-10-06 DIAGNOSIS — IMO0002 Reserved for concepts with insufficient information to code with codable children: Secondary | ICD-10-CM | POA: Insufficient documentation

## 2013-10-06 DIAGNOSIS — I129 Hypertensive chronic kidney disease with stage 1 through stage 4 chronic kidney disease, or unspecified chronic kidney disease: Secondary | ICD-10-CM | POA: Insufficient documentation

## 2013-10-06 DIAGNOSIS — N189 Chronic kidney disease, unspecified: Secondary | ICD-10-CM | POA: Insufficient documentation

## 2013-10-06 DIAGNOSIS — F411 Generalized anxiety disorder: Secondary | ICD-10-CM | POA: Insufficient documentation

## 2013-10-06 DIAGNOSIS — Z79899 Other long term (current) drug therapy: Secondary | ICD-10-CM | POA: Insufficient documentation

## 2013-10-06 NOTE — ED Notes (Signed)
Pt reports that her nose ring is stuck in her nose.

## 2013-10-07 ENCOUNTER — Encounter (HOSPITAL_COMMUNITY): Payer: Self-pay | Admitting: Psychology

## 2013-10-07 ENCOUNTER — Emergency Department (HOSPITAL_BASED_OUTPATIENT_CLINIC_OR_DEPARTMENT_OTHER)
Admission: EM | Admit: 2013-10-07 | Discharge: 2013-10-07 | Disposition: A | Discharge status: Home / Self Care | Payer: 59 | Attending: Emergency Medicine | Admitting: Emergency Medicine

## 2013-10-07 DIAGNOSIS — T171XXA Foreign body in nostril, initial encounter: Secondary | ICD-10-CM

## 2013-10-07 MED ORDER — MUPIROCIN CALCIUM 2 % EX CREA
TOPICAL_CREAM | Freq: Once | CUTANEOUS | Status: AC
  Administered 2013-10-07: 01:00:00 via TOPICAL
  Filled 2013-10-07: qty 15

## 2013-10-07 NOTE — Progress Notes (Signed)
    Daily Group Progress Note  Program: CD-IOP   Group Time: 1-2:30 pm  Participation Level: Active  Behavioral Response: Appropriate and Sharing  Type of Therapy: Process Group  Topic: Group process; the first part of group was spent in process. Everyone checked-in with their same sobriety dates. Members shared about current issues and concerns in early recovery. Included in this discussion was the topic of a sponsor. One member just secured a sponsor and she asked the group about how much of a friend a sponsor should be? This generated good conversation. One member was absent and had not phoned to explain her absence. The results of the drug tests were collected during this part of group.   Group Time: 2:45-4pm  Participation Level: Active  Behavioral Response: Sharing  Type of Therapy: Psycho-education Group  Topic: Feelings; why we feel the way we do and why it is important? The second half of group was spent in a psycho-ed session on "Feelings. Group members were provided with a 3-page handout identifying feelings, explanations for certain feelings and why it is important to identify feelings. While some members expressed their feelings and believe they understand them pretty well other members admitted they had no clue. The session was informative and demanding. The page with the "Feeling Wheel" was emphasized and members reminded they would address this wheel again in the near future.   Summary: The patient reported she was doing well and had attended a meeting last night. She explained she is working out with a group of women from her church. They call the group, "[employer]" - [employer]. The patient admitted that she has found the group very supportive and noted that there was "solace provided in this group of women". She agreed with another member that it was scary being sober because it feels much different and she felt very confident around others when she was high. Now that  she is not high, the patient admitted she isn't sure how to act. The patient disclosed that her short term disability claim had been approved and she is very pleased. In the second half of group, the patient described herself as being hopeful and faithful. The patient agreed with another, though, that being honest and open about feelings does come with danger. She is making good progress and speaks openly about much of her life. She did have an increase in the [facility] on her last drug test, but she vehemently denied smoking or using anything. At this time, her sobriety date remains 4/26.    Family Program: Family present? No   Name of family member(s):   UDS collected: No Results:   AA/NA attended?: YesThursday  Sponsor?: No   Nelson Julson, LCAS

## 2013-10-07 NOTE — ED Notes (Signed)
FB right nare removed per EDP [place]

## 2013-10-07 NOTE — ED Provider Notes (Signed)
CSN: [id number]     Arrival date & time 10/06/13  2200 History   First MD Initiated Contact with Patient 10/07/13 0029     Chief Complaint  Patient presents with  . Foreign Body in Nose     (Consider location/radiation/quality/duration/timing/severity/associated sxs/prior Treatment) HPI This is a 33 year old female who has a ring in her right nostril. Yesterday evening she got snagged on a piece of clothing and dislodged it. She normal is able to take the ring in and out but it is now stopped. There was a ball in the back the pulse again. She was able to remove the ball. There is no bleeding associated with it. There is moderate pain. There is an abnormal odor. Pain is worse with palpation or manipulation of the ring.  Past Medical History  Diagnosis Date  . Obesity   . HTN (hypertension)   . Obesity   . Depression   . Chronic kidney disease 12/03/2011    Hx of elevated protein in urine.  . Anxiety   . Shortness of breath     with exertion   History reviewed. No pertinent past surgical history. Family History  Problem Relation Age of Onset  . Bipolar disorder Mother   . Hypertension Mother   . Stroke Mother   . Anxiety disorder Sister   . Hypertension Sister   . Hypertension Father   . Cancer Maternal Aunt     breast  . Hypertension Maternal Aunt   . Hypertension Paternal Aunt   . Cancer Maternal Grandmother     lung  . Stroke Maternal Grandfather   . Cancer Paternal Grandmother     reast  . Stroke Paternal Grandmother    History  Substance Use Topics  . Smoking status: Never Smoker   . Smokeless tobacco: Never Used  . Alcohol Use: No   OB History   Grav Para Term Preterm Abortions TAB SAB Ect Mult Living                 Review of Systems  All other systems reviewed and are negative.  Allergies  Amoxicillin and Penicillins  Home Medications   Prior to Admission medications   Medication Sig Start Date End Date Taking? Authorizing Provider  citalopram  (CELEXA) 20 MG tablet Take 1 tablet (20 mg total) by mouth 2 (two) times daily. 08/29/13 08/29/14  Court Joyharles E Kober, PA-C  cloNIDine (CATAPRES) 0.1 MG tablet Take 1 tablet (0.1 mg total) by mouth 2 (two) times daily. 08/29/13 08/29/14  Court Joyharles E Kober, PA-C  hydrochlorothiazide (HYDRODIURIL) 25 MG tablet Take 1 tablet (25 mg total) by mouth daily. 08/29/13   Court Joyharles E Kober, PA-C  ibuprofen (ADVIL,MOTRIN) 200 MG tablet Take 200 mg by mouth every 6 (six) hours as needed (pain).    Historical Provider, MD  traZODone (DESYREL) 50 MG tablet Take 1 tablet (50 mg total) by mouth at bedtime. 09/05/13 12/05/13  Court Joyharles E Kober, PA-C   BP 140/89  Pulse 80  Temp(Src) 98.4 F (36.9 C) (Oral)  Resp 20  SpO2 99%  Physical Exam General: Well-developed, well-nourished female in no acute distress; appearance consistent with age of record HENT: normocephalic; metal ring in right lateral nostril without swelling, bleeding or purulent drainage but with moderate tenderness Eyes: pupils equal, round and reactive to light; extraocular muscles intact Neck: supple Heart: regular rate and rhythm Lungs: clear to auscultation bilaterally Abdomen: soft; nondistended Extremities: No deformity; full range of motion Neurologic: Awake, alert and oriented; motor function  intact in all extremities and symmetric; no facial droop Skin: Warm and dry Psychiatric: Normal mood and affect     ED Course  Procedures (including critical care time)  REMOVAL OF FOREIGN BODY The patient's nose ring was opened using Halsted forceps and it easily fell away from the nose. There were no complications the patient tolerated this well.  MDM      Hanley Seamen, MD 10/07/13 [phone]

## 2013-10-08 ENCOUNTER — Encounter (HOSPITAL_COMMUNITY): Payer: Self-pay | Admitting: Psychology

## 2013-10-08 ENCOUNTER — Other Ambulatory Visit (HOSPITAL_COMMUNITY): Payer: 59 | Attending: Psychiatry

## 2013-10-08 ENCOUNTER — Other Ambulatory Visit (HOSPITAL_COMMUNITY): Payer: 59

## 2013-10-08 DIAGNOSIS — F411 Generalized anxiety disorder: Secondary | ICD-10-CM | POA: Insufficient documentation

## 2013-10-08 DIAGNOSIS — I10 Essential (primary) hypertension: Secondary | ICD-10-CM | POA: Insufficient documentation

## 2013-10-08 DIAGNOSIS — F329 Major depressive disorder, single episode, unspecified: Secondary | ICD-10-CM | POA: Insufficient documentation

## 2013-10-08 DIAGNOSIS — F1220 Cannabis dependence, uncomplicated: Secondary | ICD-10-CM | POA: Insufficient documentation

## 2013-10-08 DIAGNOSIS — F3289 Other specified depressive episodes: Secondary | ICD-10-CM | POA: Insufficient documentation

## 2013-10-08 DIAGNOSIS — F410 Panic disorder [episodic paroxysmal anxiety] without agoraphobia: Secondary | ICD-10-CM | POA: Insufficient documentation

## 2013-10-08 DIAGNOSIS — F17200 Nicotine dependence, unspecified, uncomplicated: Secondary | ICD-10-CM | POA: Insufficient documentation

## 2013-10-08 DIAGNOSIS — F4310 Post-traumatic stress disorder, unspecified: Secondary | ICD-10-CM | POA: Insufficient documentation

## 2013-10-08 DIAGNOSIS — F1320 Sedative, hypnotic or anxiolytic dependence, uncomplicated: Secondary | ICD-10-CM | POA: Insufficient documentation

## 2013-10-09 ENCOUNTER — Other Ambulatory Visit (HOSPITAL_COMMUNITY): Payer: 59

## 2013-10-09 ENCOUNTER — Telehealth (HOSPITAL_COMMUNITY): Payer: Self-pay | Admitting: Psychology

## 2013-10-09 NOTE — Telephone Encounter (Signed)
The patient picked up and we talked about the doctor's visit yesterday afternoon. She reported they had addressed the problem inside her nose after having her nose ring get stuck and subsequently removed from the ED on Sunday night. Yesterday, the patient reported she had been given an anti-biotic and it should clear up with no problems. The patient apologized for her attitude yesterday in group. 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 This mood instability is very typical in early recovery. The patient apologized for having such a bad attitude and agreed that she should have sat up as I requested and not been such a "_itch". She also complained about her recent conversation with the case worker about her short term disability claim and how they have now delayed payment and are questioning the intensity of her drug use and whether she qualifies for any short term payments. We discussed this and I suggested she will meet with the program director tomorrow during group to address this issue. The patient thanked me and the conversation ended.

## 2013-10-09 NOTE — Progress Notes (Unsigned)
Patient ID: Alexis Mccullough, female   DOB: Jan 19, 1981, 33 y.o.   MRN: [id number] CD-IOP: The patient appeared for the group session this afternoon, but reported she had to leave at 2 pm for a doctor's appointment in [place] She had gone to the ED last night about her nose. She had had a problem with her nose ring and they had had to remove it and there were some problems related to that. The ED doc had scheduled her for an appointment today with an ENT to further address this problem. The patient stayed for an hour, but left at 2 pm. As a result, she will not be billed or credited with attending this session. She will be excused from the group session today.

## 2013-10-10 ENCOUNTER — Other Ambulatory Visit (HOSPITAL_COMMUNITY): Payer: 59 | Admitting: Psychology

## 2013-10-10 ENCOUNTER — Other Ambulatory Visit (HOSPITAL_COMMUNITY): Payer: 59

## 2013-10-10 DIAGNOSIS — F1220 Cannabis dependence, uncomplicated: Secondary | ICD-10-CM

## 2013-10-10 DIAGNOSIS — F4310 Post-traumatic stress disorder, unspecified: Secondary | ICD-10-CM

## 2013-10-10 MED ORDER — NICOTINE POLACRILEX 4 MG MT GUM: 4.0000 mg | CHEWING_GUM | OROMUCOSAL | Status: DC | PRN

## 2013-10-10 MED ORDER — GABAPENTIN 100 MG PO CAPS: ORAL_CAPSULE | ORAL | Status: DC
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
## 2013-10-11 ENCOUNTER — Encounter (HOSPITAL_COMMUNITY): Payer: Self-pay | Admitting: Psychology

## 2013-10-11 ENCOUNTER — Other Ambulatory Visit (HOSPITAL_COMMUNITY): Payer: 59

## 2013-10-11 ENCOUNTER — Telehealth (HOSPITAL_COMMUNITY): Payer: Self-pay | Admitting: Psychology

## 2013-10-11 NOTE — Progress Notes (Signed)
    Daily Group Progress Note  Program: CD-IOP   Group Time: 1-2:30 pm  Participation Level: Minimal  Behavioral Response: embarrassed  Type of Therapy: Family Therapy/Psycho-Ed  Topic: Family Session/Psycho-Ed: after check-in the group was provided handouts taken from Al-Anon literature. Two family members were present today for this 'Family Session'. Originally, four family members had confirmed their plans to attend today's session, but two - a sister and mother - bowed out this morning. The session was filled with discussion about addiction, recovery, and how to deal with it from an identified patient's perspective and a loved one's perspective. It was a very powerful session with tears and heartfelt disclosures from both patient and family. During the session, the program director was meeting with group members to discuss medications and there was one discharge.   Group Time: 2:45- 4pm  Participation Level: Active  Behavioral Response: Sharing  Type of Therapy: Psycho-education Group  Topic: "Letting Go" and the Serenity Prayer: in the second half of group, the group discussed their roles using the Serenity Prayer and identifying things they could and could not change. Living this philosophy on a daily basis was emphasized for all present. The session closed with the visitors expressing their appreciation for the comfort and support provided through this session and group members expressing the benefits for them as well.   Summary: the patient reported she had attended a meeting last night. She completed the 5-minute meditation, but complained afterwards about the patient on her right making noise and trying to breath in time with the patient on the left. My co-therapist commented that she didn't need to play the victim, but it appeared that this patient over-reacted to this comment and shut down very quickly. She did not smile, make eye contact or provide comments or feedback for the  next 5 minutes. She got up, took her purse and left the group room. She did not return for at least 30 minutes. When she did return, the patient did engage and share accordingly. She provided good feedback to the visiting group members and shared about her own frustrations in early recovery. She responded well to this second half of group session and her sobriety date remains 4/26. This woman is extremely sensitive and in search of validation and attention. When she perceived criticism, she immediately shut down and stopped hearing or engaging. We will discuss this in our ndividual therapy session after group on Friday.    Family Program: Family present? No   Name of family member(s):   UDS collected: No Results:  AA/NA attended?: [place]  Sponsor?: No   Gracemarie Skeet, LCAS

## 2013-10-12 ENCOUNTER — Other Ambulatory Visit (HOSPITAL_COMMUNITY): Payer: 59 | Admitting: Psychology

## 2013-10-12 ENCOUNTER — Other Ambulatory Visit (HOSPITAL_COMMUNITY): Payer: 59

## 2013-10-12 DIAGNOSIS — F1220 Cannabis dependence, uncomplicated: Secondary | ICD-10-CM

## 2013-10-12 DIAGNOSIS — F4310 Post-traumatic stress disorder, unspecified: Secondary | ICD-10-CM

## 2013-10-15 ENCOUNTER — Other Ambulatory Visit (HOSPITAL_COMMUNITY): Payer: 59

## 2013-10-15 ENCOUNTER — Encounter (HOSPITAL_COMMUNITY): Payer: Self-pay | Admitting: Psychology

## 2013-10-15 NOTE — Progress Notes (Signed)
    Daily Group Progress Note  Program: CD-IOP   Group Time: 1-2:30 pm  Participation Level: Active  Behavioral Response: Appropriate and Sharing  Type of Therapy: Process Group  Topic: Check-in/Guest Speaker: the first part of group was spent with a guest speaker. After completing the check-in and a 5 minute meditation, group members sat memorized as a Chief Operating Officer, former group member, and a woman with 3+ years of sobriety. She shared about her struggles, the challenges that presented themselves in early recovery and what she has done on a daily basis to change her life and embrace an abstinence-based lifestyle. Upon completing her story, members asked good questions. When she left, group members couldn't stop talking about the power and hope she had impressed and filled them with.   Group Time: 2:45- 4pm  Participation Level: Active  Behavioral Response: Sharing  Type of Therapy: Process Group/Graduation  Topic: Group Process/Graduation: the second half of group was spent in process. The daily reading from a [employer] book, "The Language of Letting Go", was read. The topic was "Shame". The reading about shame seemed a contrast to the hope and empowerment delivered by the guest speaker. Members discussed this obvious discrepancy. As the session neared an end, a graduation ceremony was held honoring a member who was leaving the group and program today. His wife had arrived around 3:30 and was also present to witness the event. Kind words of hope and thanks were shared and the session proved very powerful and touching.   Summary:The patient arrived today in and looked very different than in past sessions. She had long hair, was wearing a dress and lipstick and admitted this was really the way she had been in the past. The patient noted that once she began using more and more she stopped dressing up and taking care of her like this. She had attended her 37 yo niece's graduation this  morning and her little niece had been thrilled to see her beloved aunt. The patient reported she had also walked at [facility] and done numerous things today before even noon. She also attended a 12-step meeting last night. The patient found the speaker compelling and motivating and seemed to experience a shift in her intention and sense of possibility. She shared kind word with the graduating member and sang a brief chorus in his honor. She is a member of her church's choir and demonstrated clearly that, "this girl can sing". The members responded very well to this intervention and stated she thought it had been the best session ever. Her sobriety date remains 4/26.   Family Program: Family present? No   Name of family member(s):   UDS collected: No Results:   AA/NA attended?: YesThursday  Sponsor?: No   Chidinma Clites, LCAS

## 2013-10-16 ENCOUNTER — Other Ambulatory Visit (HOSPITAL_COMMUNITY): Payer: 59

## 2013-10-17 ENCOUNTER — Other Ambulatory Visit (HOSPITAL_COMMUNITY): Payer: 59 | Admitting: Psychology

## 2013-10-17 ENCOUNTER — Other Ambulatory Visit (HOSPITAL_COMMUNITY): Payer: 59

## 2013-10-17 ENCOUNTER — Encounter (HOSPITAL_COMMUNITY): Payer: Self-pay

## 2013-10-19 ENCOUNTER — Other Ambulatory Visit (HOSPITAL_COMMUNITY): Payer: 59 | Admitting: Psychology

## 2013-10-19 DIAGNOSIS — F4310 Post-traumatic stress disorder, unspecified: Secondary | ICD-10-CM

## 2013-10-19 DIAGNOSIS — F1220 Cannabis dependence, uncomplicated: Secondary | ICD-10-CM

## 2013-10-21 ENCOUNTER — Encounter (HOSPITAL_COMMUNITY): Payer: Self-pay | Admitting: Psychology

## 2013-10-21 NOTE — Progress Notes (Signed)
    Daily Group Progress Note  Program: CD-IOP   Group Time: 1-2:30 pm  Participation Level: Minimal  Behavioral Response: patient arrived late and shared little, but did read from the handout when appropriate  Type of Therapy: Psycho-education Group  Topic:Psycho-Ed: "Acceptance of Self and Others". The first part of group was spent in a psycho-ed session. A handout was provided identifying the importance of accepting self and others. A very good prayer/affirmation was also included which members read together. It also included a paragraph on "Emotional Honesty" and this also proved very important and is emphasized in early recovery. Members took turns reading parts of the handout and commented on each point identified. The handout generated good discussion and discloser among group members.   Group Time: 2:45-4 pm  Participation Level: Active  Behavioral Response: Appropriate and Sharing  Type of Therapy: Process Group  Topic: Process/Introduction: the second part of group was spent in process. Present today were two new group members and they introduced themselves and explained what had brought them here. Each of the two new members, though very different in some respects, identified 'isolation' as one of the things that contributed to their drinking and increased as their drinking increased. There was good feedback and comments. The session closed with members sharing their weekend plans.  Summary: the patient arrived late for group. She had phoned earlier and explained her car problem, but that she would get here to group today. She read from the handout when her time came - and the patient displayed good insight by "owning" her stuff. She explained that she had always come to the rescue of family members, but now as she continued to do it through the years she began to resent them and feel as if they were using her. She now recognized it was really her fault because she kept doing  for them despite not really wanting to - it was really all she knew. Plus it was a way to get attention and applauded for being so kind and generous. The patient explained in process, what she had told her sister about needing her car back by the 19th if not earlier. She displayed good assertive behavior and the group applauded this news. The patient reported she will attend the [employer] meetings this weekend and find a sponsor. She responded well to this intervention and her sobriety date remains 4/26.    Family Program: Family present? No   Name of family member(s):   UDS collected: No Results: her drug tests remain positive for THC, but levels are going down.  AA/NA attended?: YesThursday  Sponsor?: No   Jamal Pavon, LCAS

## 2013-10-22 ENCOUNTER — Other Ambulatory Visit (HOSPITAL_COMMUNITY): Payer: 59 | Admitting: Psychology

## 2013-10-22 DIAGNOSIS — F4310 Post-traumatic stress disorder, unspecified: Secondary | ICD-10-CM

## 2013-10-22 DIAGNOSIS — F1220 Cannabis dependence, uncomplicated: Secondary | ICD-10-CM

## 2013-10-23 ENCOUNTER — Encounter (HOSPITAL_COMMUNITY): Payer: Self-pay | Admitting: Psychology

## 2013-10-23 NOTE — Progress Notes (Signed)
    Daily Group Progress Note  Program: CD-IOP   Group Time: 1-2:30 pm  Participation Level: Active  Behavioral Response: Appropriate and Sharing  Type of Therapy: Psycho-education Group  Topic: Psycho-Ed: Visit from the Pharmacist: the first part of group was spent in a psycho-ed. A pharmacist from upstairs in the residential hospital shared details about drugs, withdrawal symptoms and the way medications assist in addressing numerous symptoms in early recovery. A good discussion with questions about these issues followed. Also present today were two PA students from [employer].    Group Time: 2:45- 4pm  Participation Level: Active  Behavioral Response: Sharing  Type of Therapy: Process Group  Topic: Process: the second part of group was spent in process. Members shared about current issues and concerns in early recovery. After a weekend, members had plenty to share. Many challenges presented themselves, but per their report, no one used over the weekend. During this group session, members were asked to submit random drug tests. There was good feedback and sharing among group members.   Summary: The patient asked questions about cannabis and symptoms of withdrawal. She also agreed with another member that her sleep had been disrupted when she smoked and also when she stopped smoking. The patient also admitted she had been smoking a lot of cigarettes after she stopped using and she had never really smoked before. In process, the patient described a chaotic weekend filled with dysfunctional drug-using or manipulating family members. She described how she had been 'assertive' with her sister and how she had avoided aggressive behaviors, left the scene of a potential problem and phoned another group members. She reported the group member had calmed her down and, 'she really helped me'. The patient continues with a lack of support from her family, which has proven very discouraging. She  also has no money or income and this contributes more stress and struggle. The patient was open about her struggles and received good feedback. As per her report, the patient's sobriety date remains 4/26. A drug test was collected today and we are waiting a negative drug test - her THC levels have decreased in the past few tests, but she remains positive. We will continue to follow closely in the days ahead.    Family Program: Family present? No   Name of family member(s):   UDS collected: Yes Results: not returned yet  AA/NA attended?: YesFriday and Sunday  Sponsor?: Yes, a member of her church and running group is her temporary sponsor   EVANS,ANN, LCAS

## 2013-10-24 ENCOUNTER — Other Ambulatory Visit (HOSPITAL_COMMUNITY): Payer: 59 | Admitting: Psychology

## 2013-10-24 DIAGNOSIS — F4310 Post-traumatic stress disorder, unspecified: Secondary | ICD-10-CM

## 2013-10-24 DIAGNOSIS — F1220 Cannabis dependence, uncomplicated: Secondary | ICD-10-CM

## 2013-10-24 NOTE — Progress Notes (Signed)
Patient ID: Alexis Mccullough, female   DOB: 1980/08/06, 33 y.o.   MRN: [id number] ADMINISTRATIVE NOTE  PEER TO PEER WITH DR RATE FOR [place] 3:45 PM  DISABILITY APPROVED FROM [place] 6-JUNE 8. MAY APPEAL FOR FURTHER BENEFITS-NEEDS RECORDS OF MENTAL STATUS/CLINICAL PROGRESS ETC FOR TIME REQUESTED IE COUNSELOR NOTES LAST MEDICAL NOTE HE HAD WAS 4/49-SHE HAS A JUNE 3 NOTE THAT CAN BE SENT UDS WILL DEMONSTRATE PROGRESS BUT NOT CLEAN-THINK THIS IS MAIN PONT FOR EXTENDING

## 2013-10-26 ENCOUNTER — Other Ambulatory Visit (HOSPITAL_COMMUNITY): Payer: 59 | Admitting: Psychology

## 2013-10-26 DIAGNOSIS — F1220 Cannabis dependence, uncomplicated: Secondary | ICD-10-CM

## 2013-10-26 DIAGNOSIS — F4310 Post-traumatic stress disorder, unspecified: Secondary | ICD-10-CM

## 2013-10-29 ENCOUNTER — Other Ambulatory Visit (HOSPITAL_COMMUNITY): Payer: 59 | Admitting: Psychology

## 2013-10-29 ENCOUNTER — Encounter (HOSPITAL_COMMUNITY): Payer: Self-pay | Admitting: Psychology

## 2013-10-29 NOTE — Progress Notes (Addendum)
    Daily Group Progress Note  Program: CD-IOP   Group Time: 1-2:30 pm  Participation Level:  Behavioral Response:   Type of Therapy:   Topic: Group Time: 2:45-4pm  Participation Level:   Behavioral Response:   Type of Therapy:   Topic:  Summary: TFamily Program: Family present?    Name of family member(s):   UDS collected:  Results:  AA/NA attended?:   Sponsor?:    EVANS,ANN, LCAS

## 2013-10-30 ENCOUNTER — Encounter (HOSPITAL_COMMUNITY): Payer: Self-pay | Admitting: Psychology

## 2013-10-30 NOTE — Progress Notes (Signed)
    Daily Group Progress Note  Program: CD-IOP   Group Time: 1-2:30 pm  Participation Level: Active  Behavioral Response: Appropriate and Sharing  Type of Therapy: Process Group  Topic: Process: the first part of group was spent in process. Members shared bout current issues and concerns. There were many problems or struggles that group members had and most of these complications included family or close friends. There was good disclosure and feedback among group members in this session.  Group Time: 2:45- 4pm  Participation Level: Active  Behavioral Response: Appropriate and Sharing  Type of Therapy: Psycho-education Group  Topic: Relapse Prevention; Refusal Skills/Graduation: the second half of group began with some role-playing on refusal skills. Members role-played a scenario at a convenience store with member in early recovery running into his former drug-dealer. The exchange was difficult and the "audience" provided good feedback and suggestions. As the session neared the end, a graduation exercise was held for a member held in high regard by his fellow group members. It was heartfelt and emotional as the group and this member said good-bye. To complicate and add to the emotion, another member disclosed that she had to leave today or would likely lose her job. This prompted more feedback and good disclosure.   Summary:The patient reported a very stressful morning. She had fully attended to go against recommendations to appear for her dead fiance's sister's funeral, but admitted all she could think of was him and it generated full panic attack. After receiving a call from my co-therapist, right at the perfect time, she opted not to go and just go to an AA meeting and come to group. At the break, the patient received a message from her HR/Supervisor confirming she had received short-term disability for the past two months, but she would be expected back to work on Monday. In the  second half of group, the patient shared that she would have to leave group after today. It was a painful disclosure, but it did not appear there was anything else she could do. During the graduation, this patient shared kind and soulful words with the member leaving. She also expressed her disappointment that this was just one more thing she would not finish. She leaves the program unfinished due to the failure to clear THC from her system. The patient leaves with a sobriety date of 4/26.    Family Program: Family present? No   Name of family member(s):   UDS collected: No Results:  AA/NA attended?: YesThursday and Friday  Sponsor?: Yes   EVANS,ANN, LCAS

## 2013-10-31 ENCOUNTER — Encounter (HOSPITAL_COMMUNITY): Payer: Self-pay | Admitting: Psychology

## 2013-10-31 ENCOUNTER — Other Ambulatory Visit (HOSPITAL_COMMUNITY): Payer: 59

## 2013-11-02 ENCOUNTER — Other Ambulatory Visit (HOSPITAL_COMMUNITY): Payer: 59

## 2013-11-05 ENCOUNTER — Other Ambulatory Visit (HOSPITAL_COMMUNITY): Payer: 59

## 2013-11-07 ENCOUNTER — Other Ambulatory Visit (HOSPITAL_COMMUNITY): Payer: 59

## 2013-11-12 ENCOUNTER — Encounter: Payer: Self-pay | Admitting: Psychiatry

## 2013-11-12 ENCOUNTER — Other Ambulatory Visit (HOSPITAL_COMMUNITY): Payer: 59

## 2013-11-14 ENCOUNTER — Other Ambulatory Visit (HOSPITAL_COMMUNITY): Payer: 59

## 2013-11-16 ENCOUNTER — Ambulatory Visit (INDEPENDENT_AMBULATORY_CARE_PROVIDER_SITE_OTHER): Payer: 59 | Admitting: Psychiatry

## 2013-11-16 ENCOUNTER — Other Ambulatory Visit (HOSPITAL_COMMUNITY): Payer: 59

## 2013-11-16 ENCOUNTER — Encounter (HOSPITAL_COMMUNITY): Payer: Self-pay | Admitting: Psychiatry

## 2013-11-16 VITALS — Wt 362.0 lb

## 2013-11-16 DIAGNOSIS — F1220 Cannabis dependence, uncomplicated: Secondary | ICD-10-CM

## 2013-11-16 DIAGNOSIS — F4310 Post-traumatic stress disorder, unspecified: Secondary | ICD-10-CM

## 2013-11-16 DIAGNOSIS — F1910 Other psychoactive substance abuse, uncomplicated: Secondary | ICD-10-CM

## 2013-11-16 MED ORDER — CITALOPRAM HYDROBROMIDE 20 MG PO TABS: 20.0000 mg | ORAL_TABLET | Freq: Two times a day (BID) | ORAL | Status: DC
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
## 2013-11-19 ENCOUNTER — Other Ambulatory Visit (HOSPITAL_COMMUNITY): Payer: 59

## 2013-11-21 ENCOUNTER — Other Ambulatory Visit (HOSPITAL_COMMUNITY): Payer: 59

## 2013-11-23 ENCOUNTER — Other Ambulatory Visit (HOSPITAL_COMMUNITY): Payer: 59

## 2013-11-26 ENCOUNTER — Other Ambulatory Visit (HOSPITAL_COMMUNITY): Payer: 59

## 2013-12-03 NOTE — Progress Notes (Signed)
Daily Group Progress Note  Program: CD-IOP   Group Time: 1-2:30 pm  Participation Level: Active  Behavioral Response: Sharing  Type of Therapy: Process Group  Topic: Group Process/Dopamine in the Brain: the first part of group was spent in process. Members shared about current issues and concerns. Everyone had attended at least one 12-step meeting since we last met and everyone checked-in with the same sobriety date. During this part of group the program director was meeting with the new group member. A slide show was presented showing the changes in dopamine amounts in the brain after drug use. The slides and color photos proved very helpful to group members in understanding why they struggle with feeling so flat. It was informative for everyone present.  Group Time: 2:45- 4pm  Participation Level: Active  Behavioral Response: Sharing  Type of Therapy: Psycho-education Group  Topic: Reading Step One: the second part of group was spent in a psycho-ed. A group member read his Step One worksheets that he had prepared over the course of the last few weeks. It was a very emotional and powerful reading and he became very emotional during parts of it. The group member admitted it was very helpful once he had written it and he learned a lot from doing this exercise. Another member read what his daughter had written to him while he was in detox. It also brought tears to a number of group member's eyes. We discussed the upcoming holiday and the importance of making plans and insuring everyone is accountable to somebody. The readings were powerful reminders of what addiction causes the addict as well as loved ones.   Summary: The patient reported she had attended an NA meeting lat night in [place]. She is tired and hasn't been sleeping well because her family is so chaotic. She agreed that she needs to find support and set boundaries about her self and her needs. In reviewing the slide  show on dopamine, she understood how her brain suffers after using and why she feels so lethargic for a long time afterwards. The patient provided good feedback to the members who read to the group in the second half of group and she made some good comments. She appears to be making some inroads regarding insight into her recovery needs  and her sobriety date remains 4/26.    Family Program: Family present? No   Name of family member(s):   UDS collected: No Results:   AA/NA attended?: YesThursday  Sponsor?: No   Jonica Bickhart, LCAS

## 2013-12-05 NOTE — Progress Notes (Signed)
    Daily Group Progress Note  Program: CD-IOP   Group Time: 1-2  Participation Level: Active  Behavioral Response: Appropriate and Sharing  Type of Therapy: Process Group  Topic: Process group. This patient shared at length about the stress she was feeling around caring for her aging grandmother now that her aunt had been kicked out of the home. She admitted that she had not attended AA or NA since the last session. She appeared opena dn was willing to ask for and receive help and suggestions from the group.         Group Time:2-3  Participation Level: Minimal  Behavioral Response: Appropriate  Type of Therapy: Process Group  Topic: Process group continued   Summary: The patient was more quiet during this half of the group. However, she did interact with another member when he was sharing about his marriage and honesty. This patient provided helpful words and appeared invested in the discussion. She reported feeling more calm and peaceful towards the end of the session.    Family Program: Family present? NA   Name of family member(s):   UDS collected: No Results:   AA/NA attended?: No  Sponsor?: Yes   [facility]

## 2013-12-07 NOTE — Progress Notes (Signed)
    Daily Group Progress Note  Program: CD-IOP   Group Time: 1-2  Participation Level: Active  Behavioral Response: Appropriate, Sharing, Grandiose and Agitated  Type of Therapy: Process Group  Topic: The patient appeared for group with a lot of anxiety and frustration about her current work situation. She has still not heard back about her short term disability and has a lot of financial worries. She was honest with the group about this and admitted that it is not worth being so upset over and that she doesn't want to relapse about it. She received good support from group members and appeared to calm down some after she shared. She has maintained sobriety and gone to meetings.      Group Time: 2-3  Participation Level: Active  Behavioral Response: Appropriate and Sharing  Type of Therapy: Psycho-education Group  Topic: Recovery Nuggets   Summary: the patient shared that she is trying to focus on the idea that "Only higher power can see around corners". She admitted that she spends a lot of energy trying to control and figure out situations and that she needs to remember God's role in her life.    Family Program: Family present? No   Name of family member(s):   UDS collected: NA Results:   AA/NA attended?: YesThursday and Friday  Sponsor?: No   [facility]

## 2013-12-10 NOTE — Progress Notes (Signed)
    Daily Group Progress Note  Program: CD-IOP   Group Time: 1-2  Participation Level: Active  Behavioral Response: Appropriate and Sharing  Type of Therapy: Process Group  Topic: process group: Patient shared about her weekend with the group. She attended one AA meeting and began looking for a sponsor. She also spent time working out and has started running with a running group. She was encouraged by the group to continue doing this. She also shared about singing at church for the first time since she has been sober. She said it was a little scary but really good and that she felt proud afterwards. She shared a little about a relationship she is considering getting into and the group was gentle but firm with her in encouraging her to stay single so early in recovery. Patient agreed and said she was relieved to hear others say that because she felt like that might be best for her.      Group Time: 2-3  Participation Level: Minimal  Behavioral Response: Appropriate  Type of Therapy: Process Group  Topic: process group cont   Summary: Pt was quiet in the second half of group but she was appropriate and shared that she could relate to what other members were saying regarding PAWS symptoms.    Family Program: Family present? NA   Name of family member(s):   UDS collected: No Results:   AA/NA attended?: OmanesMonday and Saturday  Sponsor?: No   [facility]

## 2013-12-19 ENCOUNTER — Ambulatory Visit (HOSPITAL_COMMUNITY): Payer: Self-pay | Admitting: Psychiatry

## 2013-12-26 ENCOUNTER — Encounter (HOSPITAL_COMMUNITY): Payer: Self-pay | Admitting: Psychiatry

## 2013-12-26 ENCOUNTER — Ambulatory Visit (INDEPENDENT_AMBULATORY_CARE_PROVIDER_SITE_OTHER): Payer: 59 | Admitting: Psychiatry

## 2013-12-26 VITALS — BP 159/111 | HR 83 | Ht 66.0 in | Wt 370.0 lb

## 2013-12-26 DIAGNOSIS — F1910 Other psychoactive substance abuse, uncomplicated: Secondary | ICD-10-CM

## 2013-12-26 DIAGNOSIS — F1220 Cannabis dependence, uncomplicated: Secondary | ICD-10-CM

## 2013-12-26 DIAGNOSIS — F1994 Other psychoactive substance use, unspecified with psychoactive substance-induced mood disorder: Secondary | ICD-10-CM

## 2013-12-26 DIAGNOSIS — F39 Unspecified mood [affective] disorder: Secondary | ICD-10-CM

## 2013-12-26 DIAGNOSIS — F4310 Post-traumatic stress disorder, unspecified: Secondary | ICD-10-CM

## 2013-12-26 MED ORDER — LAMOTRIGINE 25 MG PO TABS: ORAL_TABLET | ORAL | Status: DC

## 2013-12-26 MED ORDER — CITALOPRAM HYDROBROMIDE 20 MG PO TABS: 20.0000 mg | ORAL_TABLET | Freq: Two times a day (BID) | ORAL | Status: DC

## 2013-12-26 NOTE — Progress Notes (Signed)
[facility] [address] Progress Note  Rupal N Caporaso [id number] 33 y.o.  12/26/2013 2:28 PM  Chief Complaint:  I been out from Celexa.  I have a lot of dizziness, sweating.    History of Present Illness:  Aneisha came for her followup appointment.  She came today late because she was stuck in traffic .  She is complaining of dizziness, sweating, being in the ears , blurry vision and very tired .  She told that she has been out of Celexa for a few days however she did not call for refills.  She is not using any drugs and drinking alcohol.  She claims to be sober since Josetta.  She feels Celexa is working very well and she is going to work full-time.  She is actually have promotion and she is happy about the job.  She is not using any benzodiazepine.  Despite on Celexa she is to have irritability, anger, mood swings.  We had talked about starting Lamictal on the last visit and now she realized she needs something to help her mood .  She has done a lot of research on Lamictal and she feel that she is ready to try Lamictal.  She is sleeping on and off.  She denies any active or passive suicidal thoughts or homicidal thoughts.  She denies any paranoia or hallucination however she is to have racing thoughts, and issues.  She stopped taking Neurontin because she felt it was not helping.  Her appetite is okay.  She denies any tremors or any shakes.  She is trying to keep herself busy in church and other social activities.  She is living with her grandmother however her plan is to move out and buy her own house.  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  Suicidal Ideation: No Plan Formed: No Patient has means to carry out plan: No  Homicidal Ideation: No Plan Formed: No Patient has means to carry out plan: No  Medical History; Patient has obesity and hypertension.  Her primary care physician is Dr. Susie Cassette at [facility].  Past  Psychiatric History/Hospitalization(s) Patient endorsed history of depression, mood swings, irritability and anger issues the past many years.  She is at least 2 psychiatric hospitalization.  In 2007 she was hospitalized at [facility] because of depression and in 2012 she was admitted due to suicidal thoughts.  She used to see Dr. Lendon Ka who prescribed her Wellbutrin, Prozac and Prestiq.  She was prescribed Lexapro in this office last year but she did not see any improvement.  She finished 2 times IOP and once CD IOP program.  She was also seen by Dr Lenore Cordia and given Klonopin, Xanax, Adderall .  Patient denies any history of suicidal attempt.  She denies any history of psychosis and hallucination. Anxiety: Yes Bipolar Disorder: Patient has history of mood swings, anger, highs or lows in her mood, spending more money with impulsive behavior. Depression: Yes Mania: History of highs and lows Psychosis: No Schizophrenia: No Personality Disorder: No Hospitalization for psychiatric illness: Yes History of Electroconvulsive Shock Therapy: No Prior Suicide Attempts: No   Review of Systems: Psychiatric: Agitation: Yes Hallucination: No Depressed Mood: No Insomnia: No Hypersomnia: No Altered Concentration: No Feels Worthless: No Grandiose Ideas: No Belief In Special Powers: No New/Increased Substance Abuse: No Compulsions: No  Neurologic: Headache: No Seizure: No Paresthesias: No    Outpatient Encounter Prescriptions as of 12/26/2013  Medication Sig  .  cloNIDine (CATAPRES) 0.1 MG tablet Take 1 tablet (0.1 mg total) by mouth 2 (two) times daily.  . medroxyPROGESTERone (DEPO-PROVERA) 150 MG/ML injection Inject 150 mg into the muscle.  . valACYclovir (VALTREX) 500 MG tablet One po daily  . citalopram (CELEXA) 20 MG tablet Take 1 tablet (20 mg total) by mouth 2 (two) times daily.  . hydrochlorothiazide (HYDRODIURIL) 25 MG tablet Take 1 tablet (25 mg total) by mouth daily.  Marland Kitchen  lamoTRIgine (LAMICTAL) 25 MG tablet Take 1 tab daily for 1 week and than 2 daily  . [DISCONTINUED] citalopram (CELEXA) 20 MG tablet Take 1 tablet (20 mg total) by mouth 2 (two) times daily.  . [DISCONTINUED] gabapentin (NEURONTIN) 100 MG capsule Take 1-2 capsules up to 3 times a day for anxiety  . [DISCONTINUED] nicotine polacrilex (NICORETTE) 4 MG gum Take 1 each (4 mg total) by mouth as needed for smoking cessation.    No results found for this or any previous visit (from the past 2160 hour(s)).    Physical Exam: Constitutional:  BP 159/111  Pulse 83  Ht 5\' 6"  (1.676 m)  Wt 370 lb (167.831 kg)  BMI 59.75 kg/m2  Musculoskeletal: Strength & Muscle Tone: within normal limits Gait & Station: normal Patient leans: N/A  Mental Status Examination;  Patient is a morbidly obese female who is casually dressed and fairly groomed.  She maintains fair eye contact.  She described her mood as irritable and emotional.  Her affect is mood appropriate.  She denies any auditory or visual hallucination.  She denies any active or passive suicidal thoughts or homicidal thoughts.  Her speech is fast.  Her thought process logical and goal-directed.  Her attention and concentration is fair.  There were no delusions, paranoia or any obsessive thoughts.  Her psychomotor activity is normal.  Her fund of knowledge is adequate.  She is alert and oriented x3.  There were no tremors or shakes.  Her insight judgment and impulse control is okay.   Review of Psycho-Social Stressors (1), Decision to obtain old records (1), Review and summation of old records (2), Established Problem, Worsening (2), Review of Medication Regimen & Side Effects (2) and Review of New Medication or Change in Dosage (2)  Assessment: Axis I: Mood disorder NOS, substance-induced mood disorder, rule out bipolar disorder depressed type, rule out depressive disorder recurrent  Axis II: Deferred  Axis III:  Past Medical History  Diagnosis  Date  . Obesity   . HTN (hypertension)   . Obesity   . Depression   . Chronic kidney disease 12/03/2011    Hx of elevated protein in urine.  . Anxiety   . Shortness of breath     with exertion    Axis IV: Mild to moderate   Plan:  I talk about noncompliance with medication causing withdrawal symptoms.  She may have experiencing SSRI withdrawal symptoms .  Reinforced to take medication as prescribed and if she is running out and she should call as for refill .  We will start again Celexa 20 mg twice a day .  Continue clonidine 0.1 mg 2 times a day.  She has enough refills of Klonopin.  I will start Lamictal 25 mg daily for one week and then gradually increased to 50 mg.  Discussed risks and benefits of medication especially Lamictal causing rash and in that case she needed to stop the medication immediately.  Recommended to call us back if she has any question or any concern.  Patient  mentioned that her FMLA is expired and she may need a new forms to be completed for doctor's appointment.  I suggested have her FMLA paper faxed to us.  I will see her again in 3-4 weeks. Time spent 25 minutes.  More than 50% of the time spent in psychoeducation, counseling and coordination of care.  Discuss safety plan that anytime having active suicidal thoughts or homicidal thoughts then patient need to call 911 or go to the local emergency room.  Valori Hollenkamp T., MD 12/26/2013

## 2014-01-09 ENCOUNTER — Ambulatory Visit (INDEPENDENT_AMBULATORY_CARE_PROVIDER_SITE_OTHER): Payer: 59 | Admitting: Psychiatry

## 2014-01-09 ENCOUNTER — Encounter (HOSPITAL_COMMUNITY): Payer: Self-pay | Admitting: Psychiatry

## 2014-01-09 VITALS — BP 128/85 | HR 89 | Ht 66.0 in | Wt 367.2 lb

## 2014-01-09 DIAGNOSIS — F1994 Other psychoactive substance use, unspecified with psychoactive substance-induced mood disorder: Secondary | ICD-10-CM

## 2014-01-09 DIAGNOSIS — F4310 Post-traumatic stress disorder, unspecified: Secondary | ICD-10-CM

## 2014-01-09 DIAGNOSIS — F39 Unspecified mood [affective] disorder: Secondary | ICD-10-CM

## 2014-01-09 DIAGNOSIS — F1910 Other psychoactive substance abuse, uncomplicated: Secondary | ICD-10-CM

## 2014-01-09 MED ORDER — CITALOPRAM HYDROBROMIDE 20 MG PO TABS: 20.0000 mg | ORAL_TABLET | Freq: Two times a day (BID) | ORAL | Status: DC

## 2014-01-09 MED ORDER — LAMOTRIGINE 25 MG PO TABS: ORAL_TABLET | ORAL | Status: DC

## 2014-01-09 NOTE — Progress Notes (Signed)
[facility] [address] Progress Note  Alexis Mccullough [id number] 33 y.o.  01/09/2014 2:03 PM  Chief Complaint:  Medication management and followup.     History of Present Illness:  Alexis Mccullough came for her followup appointment.  On her last visit we started her on Lamictal .  She is taking only 25 mg.  She forgot to increase the dose.  Initially she was complaining of itching which is now getting better.  She denies any rash.  Her sleep is improved .  She continues to have some irritability and anger but denies any major aggression or mood swings.  She is taking Celexa 20 mg twice a day.  Recently she saw her primary care physician Dr. Hal Hope who started her on hydrochlorothiazide 25 mg daily.  She is also taking clonidine .  Overall she reported her mood is improved but she still have some time irritability and anger.  She has not relapsed into drinking , using drugs all using any benzodiazepine.  Do to her new job she is unable to get FMLA However she is requesting a letter/forms to be completed so she can continue to keep doctor's appointment.  She is also thinking to start counseling because she feels that she needs someone to talk.  Patient denies any paranoia, hallucination, crying spells.  She denies any active or passive suicidal thoughts or homicidal thoughts.  She has no tremors or shakes. She is living with her grandmother however her plan is to move out and buy her own house.  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  Suicidal Ideation: No Plan Formed: No Patient has means to carry out plan: No  Homicidal Ideation: No Plan Formed: No Patient has means to carry out plan: No  Medical History; Patient has obesity and hypertension.  Her primary care physician is Dr. Hal Hope at [facility].  Past Psychiatric History/Hospitalization(s) Patient endorsed history of depression, mood swings, irritability and anger issues the past  many years.  She is at least 2 psychiatric hospitalization.  In 2007 she was hospitalized at [facility] because of depression and in 2012 she was admitted due to suicidal thoughts.  She used to see Dr. Lendon Ka who prescribed her Wellbutrin, Prozac and Prestiq.  She was prescribed Lexapro in this office last year but she did not see any improvement.  She finished 2 times IOP and once CD IOP program.  She was also seen by Dr Lenore Cordia and given Klonopin, Xanax, Adderall .  Patient denies any history of suicidal attempt.  She denies any history of psychosis and hallucination. Anxiety: Yes Bipolar Disorder: Patient has history of mood swings, anger, highs or lows in her mood, spending more money with impulsive behavior. Depression: Yes Mania: History of highs and lows Psychosis: No Schizophrenia: No Personality Disorder: No Hospitalization for psychiatric illness: Yes History of Electroconvulsive Shock Therapy: No Prior Suicide Attempts: No   Review of Systems: Psychiatric: Agitation: No Hallucination: No Depressed Mood: No Insomnia: No Hypersomnia: No Altered Concentration: No Feels Worthless: No Grandiose Ideas: No Belief In Special Powers: No New/Increased Substance Abuse: No Compulsions: No  Neurologic: Headache: No Seizure: No Paresthesias: No    Outpatient Encounter Prescriptions as of 01/09/2014  Medication Sig  . citalopram (CELEXA) 20 MG tablet Take 1 tablet (20 mg total) by mouth 2 (two) times daily.  . cloNIDine (CATAPRES) 0.1 MG tablet Take 1 tablet (0.1 mg total) by mouth 2 (two) times daily.  . hydrochlorothiazide (  HYDRODIURIL) 25 MG tablet Take 1 tablet (25 mg total) by mouth daily.  Marland Kitchen lamoTRIgine (LAMICTAL) 25 MG tablet Take 2 tab daily  . medroxyPROGESTERone (DEPO-PROVERA) 150 MG/ML injection Inject 150 mg into the muscle.  . valACYclovir (VALTREX) 500 MG tablet One po daily  . [DISCONTINUED] citalopram (CELEXA) 20 MG tablet Take 1 tablet (20 mg total) by  mouth 2 (two) times daily.  . [DISCONTINUED] lamoTRIgine (LAMICTAL) 25 MG tablet Take 1 tab daily for 1 week and than 2 daily    No results found for this or any previous visit (from the past 2160 hour(s)).    Physical Exam: Constitutional:  BP 128/85  Pulse 89  Ht  (1.676 m)  Wt 367 lb 3.2 oz (166.561 kg)  BMI 59.30 kg/m2  Musculoskeletal: Strength & Muscle Tone: within normal limits Gait & Station: normal Patient leans: N/A  Mental Status Examination;  Patient is a morbidly obese female who is casually dressed and fairly groomed.  She maintains fair eye contact.  She described her mood as irritable and emotional.  Her affect is mood appropriate.  She denies any auditory or visual hallucination.  She denies any active or passive suicidal thoughts or homicidal thoughts.  Her speech is fast.  Her thought process logical and goal-directed.  Her attention and concentration is fair.  There were no delusions, paranoia or any obsessive thoughts.  Her psychomotor activity is normal.  Her fund of knowledge is adequate.  She is alert and oriented x3.  There were no tremors or shakes.  Her insight judgment and impulse control is okay.   Review of Psycho-Social Stressors (1), Review of Last Therapy Session (1), Review of Medication Regimen & Side Effects (2) and Review of New Medication or Change in Dosage (2)  Assessment: Axis I: Mood disorder NOS, substance-induced mood disorder, rule out bipolar disorder depressed type, rule out depressive disorder recurrent  Axis II: Deferred  Axis III:  Past Medical History  Diagnosis Date  . Obesity   . HTN (hypertension)   . Obesity   . Depression   . Chronic kidney disease 12/03/2011    Hx of elevated protein in urine.  . Anxiety   . Shortness of breath     with exertion    Axis IV: Mild to moderate   Plan:  Recommended to continue Celexa 20 mg twice a day .  Reinforced to take Lamictal 25 mg 2 tablet every day to help for mood  lability.  The patient has not had any side effects at this time.  I also recommended to contact her primary care physician for refills of clonidine since her primary care physician is managing her blood pressure.  She is also taking hydrochlorothiazide from her.  Risk and benefits of medication discussed especially if she developed a rash and she can stop Lamictal immediately.  I will also schedule appointment with therapist in this office her coping and social skills.  Patient will bring in forms to be completed so she can continue to come for therapy and medication management appointment.  I will see her again in 2 months.  Derricka Mertz T., MD 01/09/2014

## 2014-01-24 DIAGNOSIS — Z3009 Encounter for other general counseling and advice on contraception: Secondary | ICD-10-CM | POA: Insufficient documentation

## 2014-01-24 DIAGNOSIS — J329 Chronic sinusitis, unspecified: Secondary | ICD-10-CM | POA: Insufficient documentation

## 2014-01-25 ENCOUNTER — Ambulatory Visit (HOSPITAL_COMMUNITY): Payer: Self-pay | Admitting: Licensed Clinical Social Worker

## 2014-02-05 ENCOUNTER — Ambulatory Visit (INDEPENDENT_AMBULATORY_CARE_PROVIDER_SITE_OTHER): Payer: 59 | Admitting: Licensed Clinical Social Worker

## 2014-02-05 ENCOUNTER — Encounter (HOSPITAL_COMMUNITY): Payer: Self-pay | Admitting: Licensed Clinical Social Worker

## 2014-02-05 DIAGNOSIS — F3131 Bipolar disorder, current episode depressed, mild: Secondary | ICD-10-CM
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
## 2014-02-07 ENCOUNTER — Telehealth (HOSPITAL_COMMUNITY): Payer: Self-pay

## 2014-02-07 NOTE — Telephone Encounter (Signed)
I returned patient's phone call and left a message. 

## 2014-02-14 ENCOUNTER — Encounter (HOSPITAL_COMMUNITY): Payer: Self-pay | Admitting: Psychiatry

## 2014-02-14 ENCOUNTER — Ambulatory Visit (INDEPENDENT_AMBULATORY_CARE_PROVIDER_SITE_OTHER): Payer: 59 | Admitting: Psychiatry

## 2014-02-14 VITALS — BP 105/79 | HR 88 | Ht 66.0 in | Wt 368.8 lb

## 2014-02-14 DIAGNOSIS — F1210 Cannabis abuse, uncomplicated: Secondary | ICD-10-CM

## 2014-02-14 DIAGNOSIS — F1994 Other psychoactive substance use, unspecified with psychoactive substance-induced mood disorder: Secondary | ICD-10-CM

## 2014-02-14 DIAGNOSIS — F1010 Alcohol abuse, uncomplicated: Secondary | ICD-10-CM

## 2014-02-14 DIAGNOSIS — F1910 Other psychoactive substance abuse, uncomplicated: Secondary | ICD-10-CM

## 2014-02-14 DIAGNOSIS — F39 Unspecified mood [affective] disorder: Secondary | ICD-10-CM

## 2014-02-14 NOTE — Progress Notes (Signed)
[facility] [address] Progress Note  Alexis Mccullough [id number] 33 y.o.  02/14/2014 4:02 PM  Chief Complaint:  I relapsed into drinking.  I am binging and I'm not taking medication.       History of Present Illness:  Alexis Mccullough came earlier than her scheduled appointment .  She admitted to increased drinking and she is noncompliant with medication.  She endorse binging every day however she denies any withdrawal symptoms.  She wanted to help and like to go back on CDI OP program.  She has done the program in the past but she admitted that she was unable to engage in the program because she was not focus due to financial reasons.  Now she wants to do the program and wants to continue part-time job so she can still get money .  She admitted irritability, anger, poor sleep and feeling tired.  She also endorsed smoking marijuana .  She is taking Celexa but admitted not compliant with Lamictal.  She endorsed a lot of psychosocial issues .  She denies any paranoia or any hallucinations but endorsed crying spells, lack of energy, poor attention and concentration.  She sleep on and off.  Sometimes she is careless about her diet however her vitals are stable.  Suicidal Ideation: No Plan Formed: No Patient has means to carry out plan: No  Homicidal Ideation: No Plan Formed: No Patient has means to carry out plan: No  Medical History; Patient has obesity and hypertension.  Her primary care physician is Dr. Hal Hope at [facility].  Past Psychiatric History/Hospitalization(s) Patient endorsed history of depression, mood swings, irritability and anger issues the past many years.  She is at least 2 psychiatric hospitalization.  In 2007 she was hospitalized at [facility] because of depression and in 2012 she was admitted due to suicidal thoughts.  She used to see Dr. Lendon Ka who prescribed her Wellbutrin, Prozac and Prestiq.  She was prescribed Lexapro in this office last year but she did  not see any improvement.  She finished 2 times IOP and once CD IOP program.  She was also seen by Dr Lenore Cordia and given Klonopin, Xanax, Adderall .  Patient denies any history of suicidal attempt.  She denies any history of psychosis and hallucination. Anxiety: Yes Bipolar Disorder: Patient has history of mood swings, anger, highs or lows in her mood, spending more money with impulsive behavior. Depression: Yes Mania: History of highs and lows Psychosis: No Schizophrenia: No Personality Disorder: No Hospitalization for psychiatric illness: Yes History of Electroconvulsive Shock Therapy: No Prior Suicide Attempts: No   Review of Systems: Psychiatric: Agitation: Yes Hallucination: No Depressed Mood: Yes Insomnia: Yes Hypersomnia: No Altered Concentration: No Feels Worthless: No Grandiose Ideas: No Belief In Special Powers: No New/Increased Substance Abuse: Yes Compulsions: No  Neurologic: Headache: No Seizure: No Paresthesias: No    Outpatient Encounter Prescriptions as of 02/14/2014  Medication Sig  . citalopram (CELEXA) 20 MG tablet Take 1 tablet (20 mg total) by mouth 2 (two) times daily.  . cloNIDine (CATAPRES) 0.1 MG tablet Take 1 tablet (0.1 mg total) by mouth 2 (two) times daily.  . hydrochlorothiazide (HYDRODIURIL) 25 MG tablet Take 1 tablet (25 mg total) by mouth daily.  Marland Kitchen lamoTRIgine (LAMICTAL) 25 MG tablet Take 2 tab daily  . medroxyPROGESTERone (DEPO-PROVERA) 150 MG/ML injection Inject 150 mg into the muscle.  . valACYclovir (VALTREX) 500 MG tablet One po daily    No results found for this or any previous visit (  from the past 2160 hour(s)).    Physical Exam: Constitutional:  BP 105/79  Pulse 88  Ht 5\' 6"  (1.676 m)  Wt 368 lb 12.8 oz (167.287 kg)  BMI 59.55 kg/m2  Musculoskeletal: Strength & Muscle Tone: within normal limits Gait & Station: normal Patient leans: N/A  Mental Status Examination;  Patient is a morbidly obese female who is casually  dressed and fairly groomed.  She is tearful and maintain poor eye contact.  She is using her dark shades. She described her mood as irritable, sad depressed and emotional.  Her affect is constricted.  She denies any auditory or visual hallucination.  She denies any active or passive suicidal thoughts or homicidal thoughts.  Her speech is fast.  Her thought process logical and goal-directed.  Her attention and concentration is fair.  There were no delusions, paranoia or any obsessive thoughts.  Her psychomotor activity is normal.  Her fund of knowledge is adequate.  She is alert and oriented x3.  There were no tremors or shakes.  Her insight judgment and impulse control is okay.   New problem, with additional work up planned, Review of Psycho-Social Stressors (1), Established Problem, Worsening (2), New Problem, with no additional work-up planned (3), Review of Last Therapy Session (1), Review of Medication Regimen & Side Effects (2) and Review of New Medication or Change in Dosage (2)  Assessment: Axis I: Mood disorder NOS, substance-induced mood disorder, alcohol abuse, marijuana abuse rule out bipolar disorder depressed type.    Axis II: Deferred  Axis III:  Past Medical History  Diagnosis Date  . Obesity   . HTN (hypertension)   . Obesity   . Depression   . Chronic kidney disease 12/03/2011    Hx of elevated protein in urine.  . Anxiety   . Shortness of breath     with exertion    Axis IV: Mild to moderate   Plan:  Recommended CD IOP program .  Recommended to restart Lamictal 25 mg 2 tablet daily.  Patient will continue part-time job to help her finances .  Discuss case with Dewayne HatchAnn at CDIOP rogram.  Discussed medication side effects and benefits.  Patient will start program next week.  I will see her again when she finished the program.  5 she will be in the program her medication will be managed by Maryjean Mornharles Kober. Time spent 25 minutes.  More than 50% of the time spent in psychoeducation,  counseling and coordination of care.  Discuss safety plan that anytime having active suicidal thoughts or homicidal thoughts then patient need to call 911 or go to the local emergency room.  Domanick Cuccia T., MD 02/14/2014

## 2014-02-15 ENCOUNTER — Ambulatory Visit (HOSPITAL_COMMUNITY): Payer: Self-pay | Admitting: Licensed Clinical Social Worker

## 2014-02-18 ENCOUNTER — Other Ambulatory Visit (HOSPITAL_COMMUNITY): Payer: 59 | Attending: Psychiatry | Admitting: Psychology

## 2014-02-18 ENCOUNTER — Encounter (HOSPITAL_COMMUNITY): Payer: Self-pay | Admitting: Psychology

## 2014-02-18 ENCOUNTER — Other Ambulatory Visit (HOSPITAL_COMMUNITY): Payer: 59 | Admitting: Medical

## 2014-02-18 DIAGNOSIS — F1020 Alcohol dependence, uncomplicated: Secondary | ICD-10-CM | POA: Diagnosis present

## 2014-02-18 DIAGNOSIS — N189 Chronic kidney disease, unspecified: Secondary | ICD-10-CM | POA: Diagnosis not present

## 2014-02-18 DIAGNOSIS — I129 Hypertensive chronic kidney disease with stage 1 through stage 4 chronic kidney disease, or unspecified chronic kidney disease: Secondary | ICD-10-CM | POA: Insufficient documentation

## 2014-02-18 DIAGNOSIS — Z599 Problem related to housing and economic circumstances, unspecified: Secondary | ICD-10-CM | POA: Diagnosis not present

## 2014-02-18 DIAGNOSIS — F4310 Post-traumatic stress disorder, unspecified: Secondary | ICD-10-CM | POA: Diagnosis not present

## 2014-02-18 DIAGNOSIS — F1220 Cannabis dependence, uncomplicated: Secondary | ICD-10-CM

## 2014-02-18 DIAGNOSIS — F1320 Sedative, hypnotic or anxiolytic dependence, uncomplicated: Secondary | ICD-10-CM | POA: Diagnosis not present

## 2014-02-18 DIAGNOSIS — F17210 Nicotine dependence, cigarettes, uncomplicated: Secondary | ICD-10-CM | POA: Insufficient documentation

## 2014-02-18 DIAGNOSIS — E119 Type 2 diabetes mellitus without complications: Secondary | ICD-10-CM | POA: Diagnosis not present

## 2014-02-18 DIAGNOSIS — F3131 Bipolar disorder, current episode depressed, mild: Secondary | ICD-10-CM

## 2014-02-19 ENCOUNTER — Encounter (HOSPITAL_COMMUNITY): Payer: Self-pay | Admitting: Psychology

## 2014-02-19 NOTE — Progress Notes (Signed)
    Daily Group Progress Note  Program: CD-IOP   Group Time: 1-2:30 pm  Participation Level: Active  Behavioral Response: Sharing  Type of Therapy: Process Group  Topic: Process - After checking in, group members shared what the weekend was like for them, as well as any problems that were encountered.  Two group members shared that they had relapsed and the group processed what had happened, challenging each other when rationalization was evident.  There was one new group member who was able to introduce herself and share what has led her to the program.  Group Time: 2:45- 4pm  Participation Level: Active  Behavioral Response: Appropriate and Sharing  Type of Therapy: Psycho-education Group  Topic: Psychoeducation - The second half of group was spent discussing the issues of Core Beliefs and Self-Esteem.  The group completed a free write activity in which they wrote about themselves, and then shared what they had written with a partner.  Discussion exploring what the experience was like for group members followed.  The group watched a PowerPoint, while discussing their self esteem, core beliefs, and early life experiences that contributed to the development of those core beliefs.  Summary: Today was the patient's first group meeting since relapsing.  She reported that she had used this morning and was coming down, stating that she was depressed, sad, and tired.  She told the group about herself, though she was hesitant to do so at first.  She stated that she sings in church and went thorough an ugly breakup, which ultimately led her to relapse.  She described herself as being a caretaker, but neglecting to take care of herself.  During the psychoeducation portion of group, the patient described feeling that her life is very "out of balance".  She also identified with having very low self-esteem and that her addiction feeds off of her negative core beliefs.  The patient was active during  the second portion of group and her sobriety date is 10/13.   Family Program: Family present? No   Name of family member(s):   UDS collected: No Results:   AA/NA attended?: No  Sponsor?: No   Rajvir Ernster, LCAS

## 2014-02-20 ENCOUNTER — Other Ambulatory Visit (HOSPITAL_COMMUNITY): Payer: 59 | Admitting: Psychology

## 2014-02-20 ENCOUNTER — Telehealth (HOSPITAL_COMMUNITY): Payer: Self-pay | Admitting: Psychology

## 2014-02-20 ENCOUNTER — Encounter (HOSPITAL_COMMUNITY): Payer: Self-pay | Admitting: Psychology

## 2014-02-20 DIAGNOSIS — F1320 Sedative, hypnotic or anxiolytic dependence, uncomplicated: Secondary | ICD-10-CM

## 2014-02-20 DIAGNOSIS — F1020 Alcohol dependence, uncomplicated: Secondary | ICD-10-CM | POA: Diagnosis not present

## 2014-02-20 DIAGNOSIS — F3131 Bipolar disorder, current episode depressed, mild: Secondary | ICD-10-CM

## 2014-02-20 DIAGNOSIS — F1220 Cannabis dependence, uncomplicated: Secondary | ICD-10-CM

## 2014-02-20 DIAGNOSIS — Z62810 Personal history of physical and sexual abuse in childhood: Secondary | ICD-10-CM

## 2014-02-21 ENCOUNTER — Encounter (HOSPITAL_COMMUNITY): Payer: Self-pay | Admitting: Psychology
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
## 2014-02-21 NOTE — Progress Notes (Unsigned)
Alexis Mccullough is a 33 y.o. female patient ***. CD-IOP: Treatment Planning Session. I met with this patient at the conclusion of her group session today. We discussed her goals for treatment. She agreed that staying drug and alcohol-free is her main priority and admitted that it is much more difficult than she had realized. She had been in the program previously, but had struggled financially and was unable to eat consistently or even get enough money for gas to drive here [place]. The patient admitted that building a support network was going to be essential and she reported she had not been going to 12-step meetings with any consistency and would have to in order to remain sober. Her third treatment goal included finding more balance and consistency in her life. The patient has never attained any long term consistency in her life whether it is around diet, sleep, work, exercise or just taking time for herself. We agreed that she would work on this beginning today. She admitted she had not eaten anything, but her stomach was upset and she didn't want to right now. The documentation was signed and her goals for treatment identified. We will monitor her progress each week. The patient has good insight and the support of her father, including financially, while she is in treatment.        Marlet Korte, LCAS

## 2014-02-21 NOTE — Progress Notes (Signed)
    Daily Group Progress Note  Program: CD-IOP   Group Time: 1-2:30 pm  Participation Level: Active  Behavioral Response: Sharing  Type of Therapy: Process Group  Topic: Guest: the first part of group was spent with a guest speaker. The guest was a Orthoptistchaplain in the [employer]. He shared a little about his past and then discussed the topic of core beliefs with the group. He had been informed previously that the current topic had been negative core beliefs. There was good response among group members and they enjoyed the visit by this man who did not fit their vision of a chaplain. The session was engaging and invited even more questions.   Group Time: 2:45-4pm  Participation Level: Active  Behavioral Response: Appropriate and Sharing  Type of Therapy: Psycho-education Group  Topic: Psycho-Ed: Core Beliefs: How they are formed and perpetuated. The second half of group was spent in a psycho-ed. The session included a slide show and a continuation of the session on Monday when the topic of "Core Beliefs" was first introduced. When asked for someone's core belief, a member offered hers. The belief was diagrammed on the board and the process and perpetuation of these distorted beliefs was described. There was good disclosure and feedback among group members.   Summary: The patient arrived late for group today and apologized. She had phoned and reported she was running late after having been in court this morning. She was well-dressed and group members complimented her. She laughed and reminded them, "court". The patient discussed her own beliefs and that she was raised in a very unforgiving manner within the church, as other members expressed. The patient admitted she had to withdraw and figure out what she believes, which includes a loving and forgiving God of her understanding. She did challenge a group member, but her challenge was supported by other group members. I applauded her  willingness to call others on their 'bullshit" and the patient encouraged others to call her out on hers. The patient recognized she has a number of core beliefs that will need to be addressed and is willing to do the work. The patient responded well to this intervention. Her sobriety date is 10/13.    Family Program: Family present? No   Name of family member(s):   UDS collected: No Results:  AA/NA attended?: [place]  Sponsor?: No   Kalesha Irving, LCAS

## 2014-02-22 ENCOUNTER — Other Ambulatory Visit (HOSPITAL_COMMUNITY): Payer: 59

## 2014-02-22 ENCOUNTER — Encounter (HOSPITAL_COMMUNITY): Payer: Self-pay | Admitting: Emergency Medicine

## 2014-02-22 ENCOUNTER — Emergency Department (HOSPITAL_COMMUNITY)
Admission: EM | Admit: 2014-02-22 | Discharge: 2014-02-22 | Disposition: A | Discharge status: Home / Self Care | Payer: 59 | Attending: Emergency Medicine | Admitting: Emergency Medicine

## 2014-02-22 ENCOUNTER — Encounter (HOSPITAL_COMMUNITY): Payer: Self-pay

## 2014-02-22 DIAGNOSIS — I129 Hypertensive chronic kidney disease with stage 1 through stage 4 chronic kidney disease, or unspecified chronic kidney disease: Secondary | ICD-10-CM | POA: Diagnosis not present

## 2014-02-22 DIAGNOSIS — R112 Nausea with vomiting, unspecified: Secondary | ICD-10-CM | POA: Insufficient documentation

## 2014-02-22 DIAGNOSIS — R42 Dizziness and giddiness: Secondary | ICD-10-CM | POA: Insufficient documentation

## 2014-02-22 DIAGNOSIS — N189 Chronic kidney disease, unspecified: Secondary | ICD-10-CM | POA: Diagnosis not present

## 2014-02-22 DIAGNOSIS — E669 Obesity, unspecified: Secondary | ICD-10-CM | POA: Insufficient documentation

## 2014-02-22 DIAGNOSIS — Z720 Tobacco use: Secondary | ICD-10-CM | POA: Diagnosis not present

## 2014-02-22 DIAGNOSIS — E119 Type 2 diabetes mellitus without complications: Secondary | ICD-10-CM | POA: Diagnosis not present

## 2014-02-22 DIAGNOSIS — R110 Nausea: Secondary | ICD-10-CM

## 2014-02-22 HISTORY — DX: Type 2 diabetes mellitus without complications: E11.9

## 2014-02-22 MED ORDER — MECLIZINE HCL 50 MG PO TABS: 25.0000 mg | ORAL_TABLET | Freq: Three times a day (TID) | ORAL | Status: DC | PRN

## 2014-02-22 NOTE — Discharge Instructions (Signed)
Your dizziness was likely related to not eating or taking your blood pressure meds in the morning, or could have been related to your vertigo. Take meclizine for your vertigo and nausea as needed. See your regular doctor in 1 week for follow up. Return to the ER for changes or worsening symptoms.   Dizziness  Dizziness means you feel unsteady or lightheaded. You might feel like you are going to pass out (faint). HOME CARE   Drink enough fluids to keep your pee (urine) clear or pale yellow.  Take your medicines exactly as told by your doctor. If you take blood pressure medicine, always stand up slowly from the lying or sitting position. Hold on to something to steady yourself.  If you need to stand in one place for a long time, move your legs often. Tighten and relax your leg muscles.  Have someone stay with you until you feel okay.  Do not drive or use heavy machinery if you feel dizzy.  Do not drink alcohol. GET HELP RIGHT AWAY IF:   You feel dizzy or lightheaded and it gets worse.  You feel sick to your stomach (nauseous), or you throw up (vomit).  You have trouble talking or walking.  You feel weak or have trouble using your arms, hands, or legs.  You cannot think clearly or have trouble forming sentences.  You have chest pain, belly (abdominal) pain, sweating, or you are short of breath.  Your vision changes.  You are bleeding.  You have problems from your medicine that seem to be getting worse. MAKE SURE YOU:   Understand these instructions.  Will watch your condition.  Will get help right away if you are not doing well or get worse. Document Released: 04/15/2011 Document Revised: 07/19/2011 Document Reviewed: 04/15/2011 [facility] Patient Information 2015 [employer] [place]. This information is not intended to replace advice given to you by your health care provider. Make sure you discuss any questions you have with your health care provider.

## 2014-02-22 NOTE — ED Notes (Signed)
Pt was able to keep down fluids.  

## 2014-02-22 NOTE — ED Provider Notes (Signed)
CSN: [id number]     Arrival date & time 02/22/14  1418 History   First MD Initiated Contact with Patient 02/22/14 1600     Chief Complaint  Patient presents with  . Dizziness     (Consider location/radiation/quality/duration/timing/severity/associated sxs/prior Treatment) HPI Comments: Alexis Mccullough is a 33 y.o. female with a PMHx of HTN, obesity, depression, substance abuse, and vertigo, who presents to the ED with complaints of one episode of dizziness and nausea/vomiting x1 episode (nonbloody nonbilious emesis) that occurred earlier today PTA on her way to the behavioral health appt, and they sent her here for clearance. Pt states she was on her way to an appt, hadn't eaten or drank anything or taken her morning BP meds, and she felt warm and dizzy, consistent with prior vertigo episodes, mostly occurring when going from sitting to standing. Felt like vomiting would help, and she states the episode lasted and resolved once she vomited. Feels fine now after drinking and lying down for a bit, and would like to go home. States she recently started all her home meds back, and thinks that might have caused some of her symptoms. Recently came off cocaine, benzos, EtOH, and marijuana, last use 1wk ago. States she's been feeling nauseated when she eats, which she attributes to slight withdrawals, but she's able to tolerate food and fluids, but does endorse that lately she's been drinking less fluids. Denies fevers, chills, CP, SOB, palpitations, abd pain, diarrhea, constipation, blood in stool, urinary changes, tinnitus, syncope, focal neuro deficits, weakness, or myalgias.   Patient is a 33 y.o. female presenting with dizziness. The history is provided by the patient. No language interpreter was used.  Dizziness Quality:  Vertigo Severity:  Mild Onset quality:  Sudden Duration: 15 minutes. Timing:  Sporadic Progression:  Resolved Chronicity:  Recurrent Context: standing up   Relieved by:   Lying down and fluids Worsened by:  Sitting upright and standing up Ineffective treatments:  None tried Associated symptoms: nausea and vomiting (x1 episode)   Associated symptoms: no blood in stool, no chest pain, no diarrhea, no headaches, no hearing loss, no palpitations, no shortness of breath, no syncope, no tinnitus, no vision changes and no weakness   Risk factors: hx of vertigo and new medications     Past Medical History  Diagnosis Date  . Obesity   . HTN (hypertension)   . Obesity   . Depression   . Chronic kidney disease 12/03/2011    Hx of elevated protein in urine.  . Anxiety   . Shortness of breath     with exertion  . Diabetes mellitus without complication    No past surgical history on file. Family History  Problem Relation Age of Onset  . Bipolar disorder Mother   . Hypertension Mother   . Stroke Mother   . Anxiety disorder Sister   . Hypertension Sister   . Hypertension Father   . Cancer Maternal Aunt     breast  . Hypertension Maternal Aunt   . Hypertension Paternal Aunt   . Cancer Maternal Grandmother     lung  . Stroke Maternal Grandfather   . Cancer Paternal Grandmother     reast  . Stroke Paternal Grandmother    History  Substance Use Topics  . Smoking status: Current Some Day Smoker -- 0.25 packs/day    Types: Cigarettes  . Smokeless tobacco: Never Used  . Alcohol Use: 6.0 oz/week    10 Cans of beer per week  OB History   Grav Para Term Preterm Abortions TAB SAB Ect Mult Living                 Review of Systems  Constitutional: Negative for fever and chills.  HENT: Negative for hearing loss, rhinorrhea and tinnitus.   Eyes: Negative for visual disturbance.  Respiratory: Negative for chest tightness and shortness of breath.   Cardiovascular: Negative for chest pain, palpitations, leg swelling and syncope.  Gastrointestinal: Positive for nausea and vomiting (x1 episode). Negative for abdominal pain, diarrhea, constipation and blood in  stool.  Genitourinary: Negative for dysuria and hematuria.  Musculoskeletal: Negative for arthralgias, gait problem and myalgias.  Skin: Negative for color change.  Neurological: Positive for dizziness and light-headedness. Negative for syncope, weakness, numbness and headaches.  Psychiatric/Behavioral: Negative for confusion.   10 Systems reviewed and are negative for acute change except as noted in the HPI.    Allergies  Amoxicillin; Pineapple; Strawberry; Tomato; and Penicillins  Home Medications   Prior to Admission medications   Medication Sig Start Date End Date Taking? Authorizing Provider  citalopram (CELEXA) 20 MG tablet Take 1 tablet (20 mg total) by mouth 2 (two) times daily. 01/09/14 01/09/15 Yes Cleotis NipperSyed T Arfeen, MD  hydrochlorothiazide (HYDRODIURIL) 25 MG tablet Take 1 tablet (25 mg total) by mouth daily. 08/29/13  Yes Court Joyharles E Kober, PA-C  lamoTRIgine (LAMICTAL) 25 MG tablet Take 2 tab daily 01/09/14  Yes Cleotis NipperSyed T Arfeen, MD  valACYclovir (VALTREX) 500 MG tablet Take 500 mg by mouth daily. One po daily 11/22/13  Yes Historical Provider, MD  medroxyPROGESTERone (DEPO-PROVERA) 150 MG/ML injection Inject 150 mg into the muscle. Every 3 months 11/22/13   Historical Provider, MD   BP 122/72  Pulse 80  Temp(Src) 97.8 F (36.6 C) (Oral)  Resp 19  SpO2 100% Physical Exam  Nursing note and vitals reviewed. Constitutional: She is oriented to person, place, and time. Vital signs are normal. She appears well-developed and well-nourished. No distress.  Afebrile, nontoxic, NAD  HENT:  Head: Normocephalic and atraumatic.  Mouth/Throat: Uvula is midline, oropharynx is clear and moist and mucous membranes are normal.  Eyes: Conjunctivae and EOM are normal. Pupils are equal, round, and reactive to light. Right eye exhibits no discharge. Left eye exhibits no discharge.  PERRL, EOMI  Neck: Normal range of motion. Neck supple.  Cardiovascular: Normal rate, regular rhythm, normal heart sounds and  intact distal pulses.  Exam reveals no gallop and no friction rub.   No murmur heard. RRR, nl s1/s2, no m/r/g  Pulmonary/Chest: Effort normal and breath sounds normal. No respiratory distress. She has no decreased breath sounds. She has no wheezes. She has no rhonchi. She has no rales.  Abdominal: Soft. Normal appearance and bowel sounds are normal. She exhibits no distension. There is no tenderness. There is no rigidity, no rebound and no guarding.  Musculoskeletal: Normal range of motion.  MAE x4  Neurological: She is alert and oriented to person, place, and time. She has normal strength. No cranial nerve deficit or sensory deficit. Coordination and gait normal.  CN2-12 grossly intact, sensation grossly intact in all extremities, strength 5/5 in all extremities, coordination and gait nonataxic  Skin: Skin is warm, dry and intact. No rash noted.  Psychiatric: She has a normal mood and affect.    ED Course  Procedures (including critical care time) Labs Review Labs Reviewed - No data to display  Imaging Review No results found.   EKG Interpretation None  MDM   Final diagnoses:  Dizziness  Nausea    33y/o female here for one episode of dizziness and nausea that has subsided prior to arrival. [facility] Pt tolerating PO here, no longer dizzy. Episode likely related to not having eaten anything and not taking meds prior to leaving the house going to her appt, could have component of withdrawal symptoms or could be related to recent addition of meds, but since pt is currently asymptomatic with completely benign VS and exam, doubt need for ongoing eval. Pt stable for d/c. I explained the diagnosis and have given explicit precautions to return to the ER including for any other new or worsening symptoms. The patient understands and accepts the medical plan as it's been dictated and I have answered their questions. Discharge instructions concerning home care and  prescriptions have been given. The patient is STABLE and is discharged to home in good condition.  BP 122/72  Pulse 80  Temp(Src) 97.8 F (36.6 C) (Oral)  Resp 19  SpO2 100%  Meds ordered this encounter  Medications  . meclizine (ANTIVERT) 50 MG tablet    Sig: Take 0.5 tablets (25 mg total) by mouth 3 (three) times daily as needed for dizziness or nausea.    Dispense:  30 tablet    Refill:  0    Order Specific Question:  Supervising Provider    Answer:  Vida RollerMILLER, BRIAN D [id number]]       Jnae Thomaston Strupp Camprubi-Soms, PA-C 02/22/14 [phone]

## 2014-02-22 NOTE — Progress Notes (Unsigned)
Patient ID: Alexis Mccullough, female   DOB: Jan 02, 1981, 33 y.o.   MRN: [id number] Pt was scheuled for CD IOP Adult Assessment today when she presented to group c/o being sick with c/o withdrawal from multiple substances including alcohol and benzos and pot laced with cocaine ( a "woozie" to her).Review of  her intake with counselor reveals she was "hungover and sick " when she appeared for that appt on Oct 12 . She is treated by Dr Lolly MustacheArfeen for Bipolar dx with Celexa;Lamictal and Clonidine but claims not to be taking these along with her illicit substances.Spoke with Counselor and advised patient be medically evaluated by outside provider of her choice (?PCP/Urgent Care/ED )and return when clear.

## 2014-02-22 NOTE — ED Notes (Signed)
[facility] when she went to the restroom and felt dizzy and vomited once.  Pt is recently stopped using cocaine, klonopin and ETOH 3 weeks ago [facility].  Pt does have PMH vertigo.  Vital signs 128/70, 80HR,

## 2014-02-22 NOTE — ED Provider Notes (Signed)
Medical screening examination/treatment/procedure(s) were performed by non-physician practitioner and as supervising physician I was immediately available for consultation/collaboration.  Flint MelterElliott L Airon Sahni, MD 02/22/14 [id number]

## 2014-02-25 ENCOUNTER — Other Ambulatory Visit (HOSPITAL_COMMUNITY): Payer: 59 | Admitting: Psychology

## 2014-02-25 DIAGNOSIS — F1920 Other psychoactive substance dependence, uncomplicated: Secondary | ICD-10-CM

## 2014-02-25 DIAGNOSIS — F3131 Bipolar disorder, current episode depressed, mild: Secondary | ICD-10-CM

## 2014-02-25 DIAGNOSIS — F1020 Alcohol dependence, uncomplicated: Secondary | ICD-10-CM | POA: Diagnosis not present

## 2014-02-25 DIAGNOSIS — Z62810 Personal history of physical and sexual abuse in childhood: Secondary | ICD-10-CM

## 2014-02-27 ENCOUNTER — Other Ambulatory Visit (HOSPITAL_COMMUNITY): Payer: 59

## 2014-02-28 ENCOUNTER — Encounter (HOSPITAL_COMMUNITY): Payer: Self-pay | Admitting: Psychology

## 2014-02-28 NOTE — Progress Notes (Signed)
    Daily Group Progress Note  Program: CD-IOP   Group Time: 1-2:30 pm  Participation Level: Active  Behavioral Response: Appropriate and Sharing  Type of Therapy: Process Group  Topic: Process: the first half of group was spent in process. Members shared about their weekend and any issues or concerns that may have challenged their sobriety. One member disclosed that she had relapsed over the weekend. She shared about the struggles of early recovery. Another complained about PAWS and wondered when they would go away? Members provided good feedback and support and the session was very engaging and informative.  Drug tests were collected during group today.   Group Time: 2:45- 4pm  Participation Level: Active  Behavioral Response: Sharing  Type of Therapy: Psycho-education Group  Topic: Psycho-Ed: Guilt versus Shame: what is the difference? The second half of group was spent in a psycho-ed on Guilt vs Shame. A slide show was presented and members engaged in a discussion as the slide show included questions. Members were provided a handout and asked to list things they feel guilty about and those they feel ashamed of? The session proved very enlightening to most members, many of whom admitted they hadn't realized there was a difference between the two. Members were very revealing and open about identifying their shame issues and there was a depth of disclosure not previously witnessed.    Summary: The patient reported she was feeling much better today than she had on Friday. She apologized for having appeared while feeling so bad and having to leave the session.  The patient provided good feedback to the member who disclosed her relapse and noted she had encouraged her to appear today in group and 'do the next right thing". She also admitted that she had had 3 dates on Saturday. She admitted she was questioning why and pointed out it was almost like her addiction. In the second half of  group, the patient reported she had a lot of both shame and guilt. She reported that she is ashamed of her weight and noted she always identifies herself as the "funny fat girl", before others do. The patient disclosed a degree of intimacy and depth that made her very vulnerable and more than one fellow group member noted this. The patient responded well to this intervention and her sobriety date remains 10/13.    Family Program: Family present? No   Name of family member(s):   UDS collected: Yes Results: not back yet  AA/NA attended?: No  Sponsor?: No   Darnisha Vernet, LCAS

## 2014-03-01 ENCOUNTER — Other Ambulatory Visit (HOSPITAL_COMMUNITY): Payer: 59 | Admitting: Psychology

## 2014-03-01 DIAGNOSIS — F1020 Alcohol dependence, uncomplicated: Secondary | ICD-10-CM | POA: Diagnosis not present

## 2014-03-01 DIAGNOSIS — F1920 Other psychoactive substance dependence, uncomplicated: Secondary | ICD-10-CM

## 2014-03-01 DIAGNOSIS — F3131 Bipolar disorder, current episode depressed, mild: Secondary | ICD-10-CM

## 2014-03-01 DIAGNOSIS — Z62810 Personal history of physical and sexual abuse in childhood: Secondary | ICD-10-CM

## 2014-03-01 MED ORDER — TRAZODONE HCL 100 MG PO TABS: 100.0000 mg | ORAL_TABLET | Freq: Every day | ORAL | Status: DC

## 2014-03-01 NOTE — Progress Notes (Signed)
Psychiatric Assessment ADULT  Patient Identification:  Alexis Mccullough Date of Evaluation:  03/01/2014 Chief Complaint:  Recover and try to maintain abstinence History of Chief Complaint:  Pt returns to CD IOP for treatment after leaving CDIOP at behest of her insurer who felt she did not require more traetment and relapsing about 3 weeks ago. Pt reports she was triggered to relapse by loss of relationship .She started withj intent to use 1 time. She smoked Marijuana her drug of choice but found she wasnt able to stop binging and she began to add powder cocaine to her blunts to overcome the amotivational side effect of her THC .She liked the stimulant effects of cocaine but using in binge pattern without control caused her to seek treatment again She went to NA for a month after D/C but then stopped going to meetings.She became involved with a man who cocaine/benzos and smoked pot.She was not open about her recovery with others as well.She has been under the care of DR Arfeen for her dual diagnosis of Mood Disorder and PTSD. She planes to return to his service upon completion of her CD program.She continues to take her Lamictal,Celexa and Trazodone although she stopped the Lamictal during her brief relapse.She plans to work on non CDIOP days and she has a session planned with Charmian Muff [facility] Monday with her family to discuss the need for her to reside . Also pt showed up for Group 1 week ago and c/o being "sick".She was referred to the ED where she was diagnosed as dehydrated'traeted and released.She deies any problems today requiring medical attention.  HPI see above and Counselor intake assessment Review of Systems  Constitutional: Positive for unexpected weight change (Fluctuations past months).       Fatigued  Gastrointestinal: Positive for diarrhea (started last weds taking immodiun and peptobismol).  Genitourinary: Positive for hematuria.  Skin: Positive for rash (papular rash on lt upper  shoulder).  Psychiatric/Behavioral: Positive for sleep disturbance.   Physical Exam  Constitutional: She is oriented to person, place, and time. She appears well-developed and well-nourished. No distress.  HENT:  Head: Normocephalic and atraumatic.  Right Ear: External ear normal.  Left Ear: External ear normal.  Nose: Nose normal.  Mouth/Throat: Oropharynx is clear and moist.  Eyes: Conjunctivae and EOM are normal. Pupils are equal, round, and reactive to light. Right eye exhibits no discharge. Left eye exhibits no discharge. No scleral icterus.  Neck: Normal range of motion. Neck supple. No JVD present. No tracheal deviation present. No thyromegaly present.  Cardiovascular: Normal rate and regular rhythm.   Pulmonary/Chest: Effort normal and breath sounds normal. No stridor. No respiratory distress. She has no wheezes. She has no rales.  Abdominal:  deferred  Genitourinary:  deferred  Musculoskeletal: Normal range of motion.  Neurological: She is alert and oriented to person, place, and time. No cranial nerve deficit. She exhibits normal muscle tone. Coordination normal.  Skin: Skin is warm and dry. Rash (papular rash acros upper lt shoulder) noted. She is not diaphoretic.     Mood Symptoms:  Appetite, Concentration, Energy, Mood Swings,  (Hypo) Manic Symptoms: Elevated Mood:  Negative Irritable Mood:  NA Grandiosity:  NA Distractibility:  NA Labiality of Mood:  NA Delusions:  NA Hallucinations:  NA Impulsivity:  NA Sexually Inappropriate Behavior:  NA Financial Extravagance:  NA Flight of Ideas:  NA  Anxiety Symptoms: Excessive Worry:  Yes Panic Symptoms:  No Agoraphobia:  Negative Obsessive Compulsive: Negative  Symptoms: None, Specific Phobias:  Related to death of partner in bed on top of her (PTSD) Social Anxiety:  Yes  Psychotic Symptoms:  Hallucinations: Negative None Delusions:  Negative  Except as related to her chemical dependency Paranoia:  Negative    Ideas of Reference:  Negative  PTSD Symptoms: Ever had a traumatic exposure:  Yes Had a traumatic exposure in the last month:  Yes broke up with partner-began using again Re-experiencing: Yes Flashbacks Intrusive Thoughts Nightmares Hypervigilance:  Yes Hyperarousal: Yes Difficulty Concentrating Emotional Numbness/Detachment Sleep Avoidance: Negative None  Traumatic Brain Injury: Negative na  Past Psychiatric History: Diagnosis: PTSD/Grief/Mood Disorder/Chemical Dependency  Hospitalizations:  No IP/PSY IOP;[facility]  Outpatient Care:  See above  Substance Abuse Care: See above  Self-Mutilation:  NO  Suicidal Attempts:  NONE  Violent Behaviors:  NA   Past Medical History:   Past Medical History  Diagnosis Date  . Obesity   . HTN (hypertension)   . Obesity   . Depression   . Chronic kidney disease 12/03/2011    Hx of elevated protein in urine.  . Anxiety   . Shortness of breath     with exertion  . Diabetes mellitus without complication    History of Loss of Consciousness:  Negative Seizure History:  Negative Cardiac History:  Negative Allergies:   Allergies  Allergen Reactions  . Amoxicillin   . Pineapple Swelling    Tongue, lips, and throat   . Strawberry Swelling    Tongue, throat, lips   . Tomato Itching  . Penicillins Rash   Current Medications:  Current Outpatient Prescriptions  Medication Sig Dispense Refill  . citalopram (CELEXA) 20 MG tablet Take 1 tablet (20 mg total) by mouth 2 (two) times daily.  60 tablet  1  . hydrochlorothiazide (HYDRODIURIL) 25 MG tablet Take 1 tablet (25 mg total) by mouth daily.  30 tablet  1  . lamoTRIgine (LAMICTAL) 25 MG tablet Take 2 tab daily  60 tablet  1  . meclizine (ANTIVERT) 50 MG tablet Take 0.5 tablets (25 mg total) by mouth 3 (three) times daily as needed for dizziness or nausea.  30 tablet  0  . medroxyPROGESTERone (DEPO-PROVERA) 150 MG/ML injection Inject 150 mg into the muscle. Every 3 months       . traZODone (DESYREL) 100 MG tablet Take 1 tablet (100 mg total) by mouth at bedtime.  30 tablet  2  . valACYclovir (VALTREX) 500 MG tablet Take 500 mg by mouth daily. One po daily       No current facility-administered medications for this visit.    Previous Psychotropic Medications:  Medication Dose   See list  see list                     Substance Abuse History in the last 12 months: Substance Age of 1st Use Last Use Amount Specific Type  Nicotine 32 today 1/2 ppd cigarettes  Alcohol 21` 02/16/14 20-30 drinks Beer with vodka chaser  Cannabis 14 02/18/14 4 grams marijuana  Opiates NA 0 0 0  Cocaine 28 02/15/14 1/8 gram in blunts Powder -smoked ("Woozy")  Methamphetamines NA 0 0 0  LSD NA 0 0 0  Ecstasy NA 0 0 0  Benzodiazepines 2010 10/15 1-2 Klonopin/xanax   Caffeine NA 0 0 0  Inhalants NA 0 0 0  Others:RX Adderall 32 08/07/2013 10 mg Adderall  Medical Consequences of Substance Abuse:Wgt;Blood pressure;asthma  Legal Consequences of Substance Abuse: None to date  Family Consequences of Substance Abuse: Family has significant addiction issues but Father is no longer using and stable  Blackouts:  Yes DT's:  No Withdrawal Symptoms: Yes Diaphoresis Nausea Tremors  Social History: Current Place of Residence: Lives with Aunt who has using daughters [place] Birth:  09/15/1980 [place] Family Members:M,F living no SA; 2 sisters no SA Children: 0  Sons: NA  Daughters: NA Relationships: Broke up per HPI  Developmental History: Prenatal History: NA Birth History: NA Postnatal Infancy: NA Developmental History: NA  Milestones:Sit-Up: NA Crawl NA :Walk NA :Speech:NA School History:   [employer] GPA 2.8-believes she is ADD and thats why she struggled in school Legal History: The patient has no significant history of legal issues. Hobbies/Interests: Singing/Choir/Church  Family History:   Family History  Problem  Relation Age of Onset  . Bipolar disorder Mother   . Hypertension Mother   . Stroke Mother   . Anxiety disorder Sister   . Hypertension Sister   . Hypertension Father   . Cancer Maternal Aunt     breast  . Hypertension Maternal Aunt   . Hypertension Paternal Aunt   . Cancer Maternal Grandmother     lung  . Stroke Maternal Grandfather   . Cancer Paternal Grandmother     reast  . Stroke Paternal Grandmother     Mental Status Examination/Evaluation: Objective:  Appearance: Well Groomed obese  Eye Contact::  Good  Speech:  Clear and Coherent  Volume:  Normal  Mood:  Euthymic  Affect:  Congruent and Full Range  Thought Process:  Intact  Orientation:  Full (Time, Place, and Person)  Thought Content:  WDL  Suicidal Thoughts:  No  Homicidal Thoughts:  No  Judgement:  Impaired by substance abuse  Insight:  Lacking and Shallow  Psychomotor Activity:  Normal  Akathisia:  Negative  Handed:  Right  AIMS (if indicated):  na  Assets:  Desire for Improvement Resilience Talents/Skills Vocational/Educational    Laboratory/X-Ray Psychological Evaluation(s)  No change from prior admission  See Dr Sheela StackArfeen's notes and CD IOP Counselor intake   Assessment:  See below  AXIS I Cannabiis,benzodiazepene,alcohol dependencies/Mood DO;PTSD; Hx of ADHD ?  AXIS II Deferred  AXIS III Past Medical History  Diagnosis Date  . Obesity   . HTN (hypertension)   . Obesity   . Depression   . Chronic kidney disease 12/03/2011    Hx of elevated protein in urine.  . Anxiety   . Shortness of breath     with exertion  . Diabetes mellitus without complication     AXIS IV economic problems and housing problems  AXIS V 41-50 serious symptoms   Treatment Plan/Recommendations:  [facility] Care[facility];refer to higher level of care if unsuccesful  Laboratory:  UDS per protocol  Psychotherapy:  CD IOP group and individual rx  Medications:  See list/Trazodone rx refilled today  Routine  PRN Medications:  No  Consultations:  NA  Safety Concerns:  NA  Other:  May need higher level of care?    [facility] 10/23/20151:50 PM

## 2014-03-02 ENCOUNTER — Encounter (HOSPITAL_COMMUNITY): Payer: Self-pay

## 2014-03-02 DIAGNOSIS — I159 Secondary hypertension, unspecified: Secondary | ICD-10-CM | POA: Insufficient documentation

## 2014-03-02 DIAGNOSIS — F419 Anxiety disorder, unspecified: Secondary | ICD-10-CM | POA: Insufficient documentation

## 2014-03-02 DIAGNOSIS — J45909 Unspecified asthma, uncomplicated: Secondary | ICD-10-CM | POA: Insufficient documentation

## 2014-03-02 DIAGNOSIS — I10 Essential (primary) hypertension: Secondary | ICD-10-CM | POA: Insufficient documentation

## 2014-03-04 ENCOUNTER — Encounter (HOSPITAL_COMMUNITY): Payer: Self-pay | Admitting: Psychology

## 2014-03-04 ENCOUNTER — Other Ambulatory Visit (HOSPITAL_COMMUNITY): Payer: 59
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
## 2014-03-06 ENCOUNTER — Other Ambulatory Visit (HOSPITAL_COMMUNITY): Payer: 59

## 2014-03-08 ENCOUNTER — Other Ambulatory Visit (HOSPITAL_COMMUNITY): Payer: 59

## 2014-03-11 ENCOUNTER — Other Ambulatory Visit (HOSPITAL_COMMUNITY): Payer: 59

## 2014-03-11 ENCOUNTER — Ambulatory Visit (HOSPITAL_COMMUNITY): Payer: Self-pay | Admitting: Psychiatry

## 2014-03-12 NOTE — Progress Notes (Unsigned)
Alexis Mccullough is a 33 y.o. female patient ***. CD-IOP: Counseling Session. I met with the patient this morning. She was accompanied by a friend. The patient admitted she had continued to use over the weekend and can't stop using. Her friend is a member of her church and has recently moved to [place] and met the patient at her church. The patient wondered what she could do and if there was somewhere she could go? I reviewed with her a list of treatment facilities and suggested that we phone the [facility]. I have had good reports from patient's who have gone there. A call was placed and the patient spoke with a intake specialist and provided her the requested insurance information. I reminded the patient that she has struggled with abstinence, but she has really never attained any consistent or stable housing or was even able to maintain a healthy steady diet because of her money problems. Nor has she taken her medications properly. As a result, her ability to work a program and attain any significant sobriety has been almost impossible. I encouraged her to go into residential treatment and focus on her needs. The patient agreed that she would call the facility in [facility] back by noon if she hadn't heard any thing. She asked if she should come to group? Since she had admitted smoking cannabis early this morning and had not slept, I told her to go home. We would discuss plans based on whether she isi accepted into the program in [place].  I reminded her that there are other facilities that are out there if this one doesn't accept her. She thanked me and agreed to let me know what the plan was. I will wait to hear from her, but it is clear this patient needs a higher level of care. This program cannot meet her needs.    Muhamed Luecke, LCAS

## 2014-03-13 ENCOUNTER — Other Ambulatory Visit (HOSPITAL_COMMUNITY): Payer: 59

## 2014-03-14 ENCOUNTER — Telehealth (HOSPITAL_COMMUNITY): Payer: Self-pay

## 2014-03-14 ENCOUNTER — Telehealth (HOSPITAL_COMMUNITY): Payer: Self-pay | Admitting: *Deleted

## 2014-03-14 NOTE — Telephone Encounter (Signed)
Your pt

## 2014-03-14 NOTE — Telephone Encounter (Signed)
Belenda CruiseKristin, Clinical Specialist @ [facility] called and left VM: She is doing follow up on first day out of work: 02/08/14.  What were objectively observed symptoms preventing her from working prior to hospitalization on 03/05/14? What were dates of treatment beginning 02/08/14?

## 2014-03-15 ENCOUNTER — Encounter (HOSPITAL_COMMUNITY): Payer: Self-pay | Admitting: Licensed Clinical Social Worker

## 2014-03-15 ENCOUNTER — Other Ambulatory Visit (HOSPITAL_COMMUNITY): Payer: 59

## 2014-03-16 ENCOUNTER — Encounter (HOSPITAL_COMMUNITY): Payer: Self-pay

## 2014-03-16 DIAGNOSIS — F1010 Alcohol abuse, uncomplicated: Secondary | ICD-10-CM | POA: Insufficient documentation

## 2014-03-16 DIAGNOSIS — F1421 Cocaine dependence, in remission: Secondary | ICD-10-CM | POA: Insufficient documentation

## 2014-03-16 DIAGNOSIS — F1220 Cannabis dependence, uncomplicated: Secondary | ICD-10-CM | POA: Insufficient documentation

## 2014-03-16 HISTORY — DX: Cannabis dependence, uncomplicated: F12.20

## 2014-03-16 NOTE — Progress Notes (Unsigned)
  [facility] Summary   Alexis Mccullough [id number]  Date of Admission: 02/22/2014 Date of Discharge: 03/05/14  Course of Treatment: Pt self reported unable to stop using cocaine and marijuana and that she [facility] goig [facility]  Goals and Activities to Help Maintain Sobriety: 1. Stay away from old friends who continue to drink and use mind-altering chemicals. 2. Continue practicing Fair Fighting rules in interpersonal conflicts. 3. Continue alcohol and drug refusal skills and call on support systems. 4. In patient treatment  Referrals: as above   Aftercare services: NA 1. Attend AA/NA meetings NA times per week. 2. Obtain a sponsor and a home group in AA/NA. 3. Return to Psychotherapist: after CD treatment   Next appointment: after IP treatment  Plan of Action to Address Continuing Problems: as above    Client has not participated in the development of this discharge plan and has received a copy of this completed plan  Court JoyKOBER, CHARLES E  03/16/2014   [facility]M 03/16/2014

## 2014-03-18 ENCOUNTER — Other Ambulatory Visit (HOSPITAL_COMMUNITY): Payer: 59

## 2014-03-19 NOTE — Telephone Encounter (Signed)
Alexis FerrariJackie, Clin Spec at [facility] left FA:OZHYVM:Need supporting clinical info to verify medical severity to stay out of work from 10/2 forward? Knows she started IOP on 10/12. Please call Annice PihJackie or Maurie BoettcherKristn

## 2014-03-19 NOTE — Telephone Encounter (Signed)
Verified ROI for [facility] with counselor in CDIOP. Contacted Clinical Specialist: Annice PihJackie. She stated she had talked with counselor with CD IOP in this office today and had received information regarding IOP. Was there any MD visits before IOP on 10/12 where patient was removed from work? Stated last MD visit 02/14/14 - MD had recommended pt attend CDIOP at that visit. She requested that copy of MD notes from visit 02/14/14 be faxed to [phone]

## 2014-03-19 NOTE — Telephone Encounter (Signed)
Alexis FruitKristin, [facility] at [facility] left ON:[id number]:Calling regarding disability claim-Out of owrk 10/2:What were dates of treatment from 10/2 and what objectively verified symptoms prevented her from working from 10/2 forward?

## 2014-03-20 ENCOUNTER — Other Ambulatory Visit (HOSPITAL_COMMUNITY): Payer: 59

## 2014-03-22 ENCOUNTER — Other Ambulatory Visit (HOSPITAL_COMMUNITY): Payer: 59

## 2014-03-28 ENCOUNTER — Ambulatory Visit (HOSPITAL_COMMUNITY): Payer: Self-pay | Admitting: Psychiatry

## 2014-04-03 DIAGNOSIS — Z6841 Body Mass Index (BMI) 40.0 and over, adult: Secondary | ICD-10-CM

## 2014-04-18 ENCOUNTER — Encounter: Payer: Self-pay | Admitting: Psychiatry

## 2014-05-20 IMAGING — US US ABDOMEN COMPLETE
1 series · 14 of 25 positions shown · non-contrast
Comparison: None.

CLINICAL DATA: Chest pain.

COMPLETE ABDOMINAL ULTRASOUND

[Series 1: us abdomen complete · 0.35mm/px · 14 of 76 slices shown]
[im 1/76]
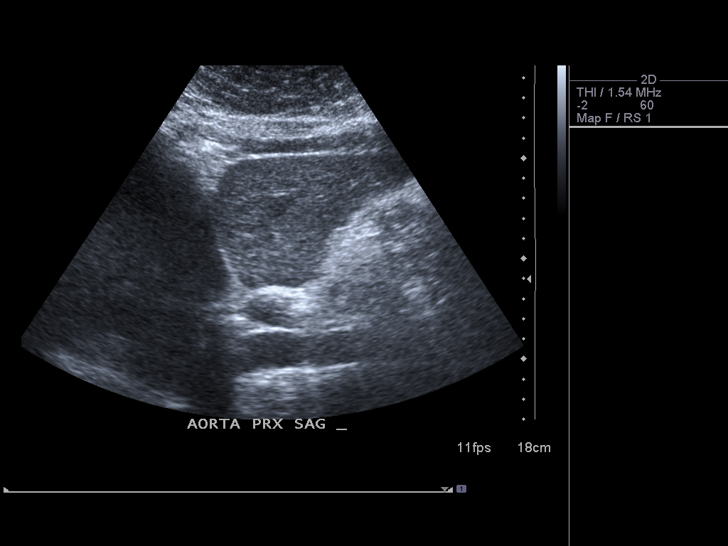
[im 7/76]
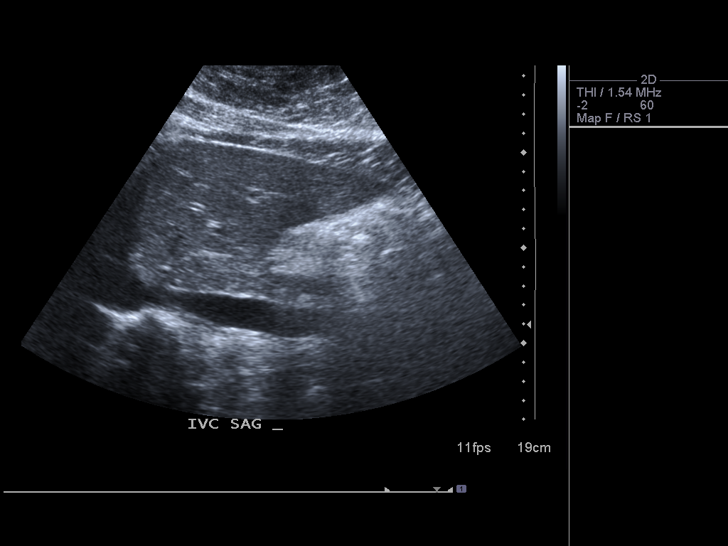
[im 13/76]
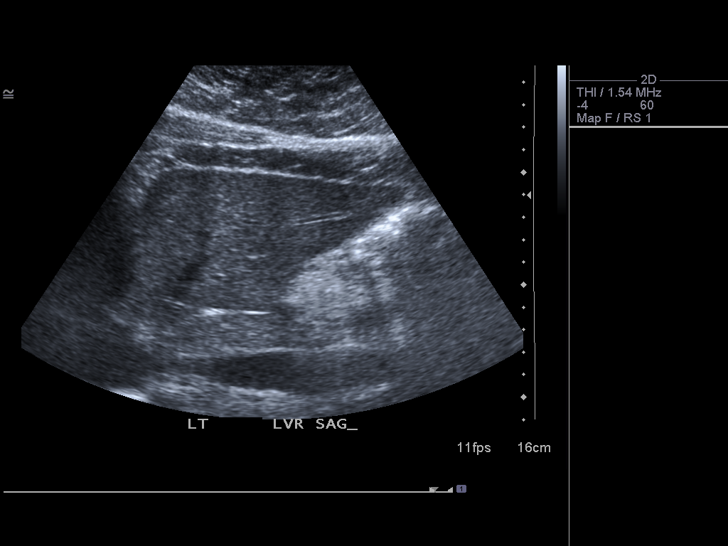
[im 19/76]
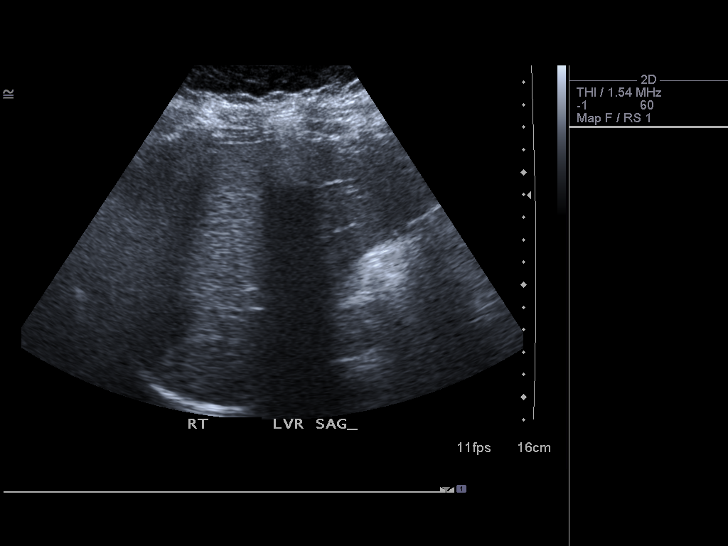
[im 26/76]
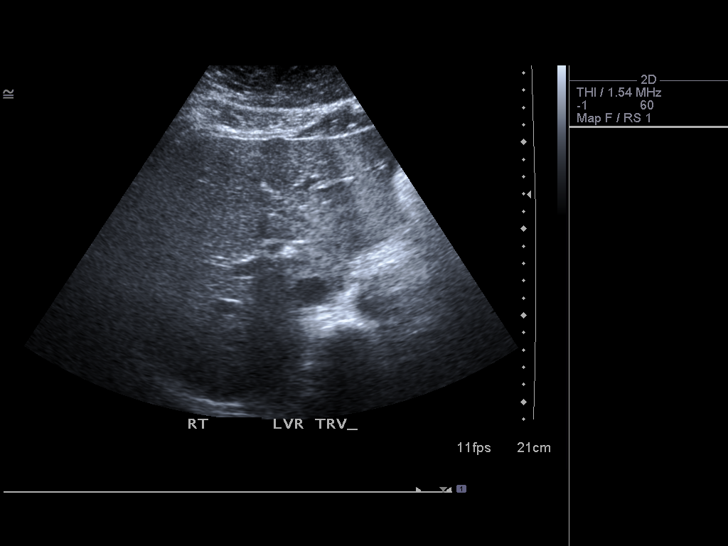
[im 29/76]
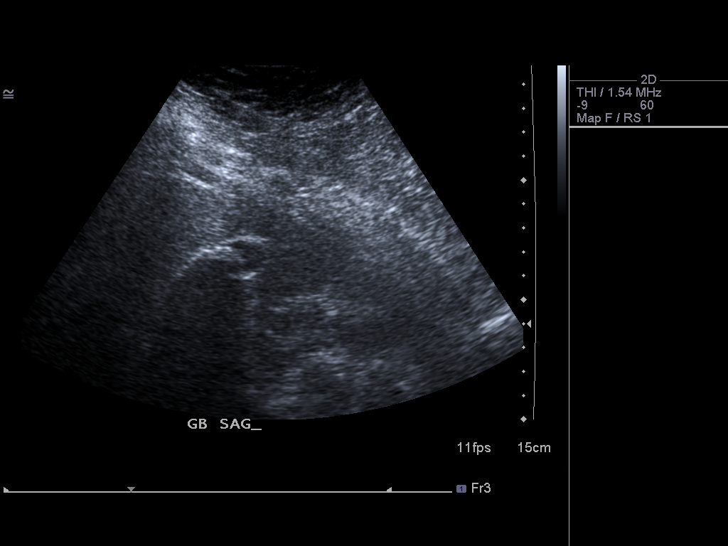
[im 35/76]
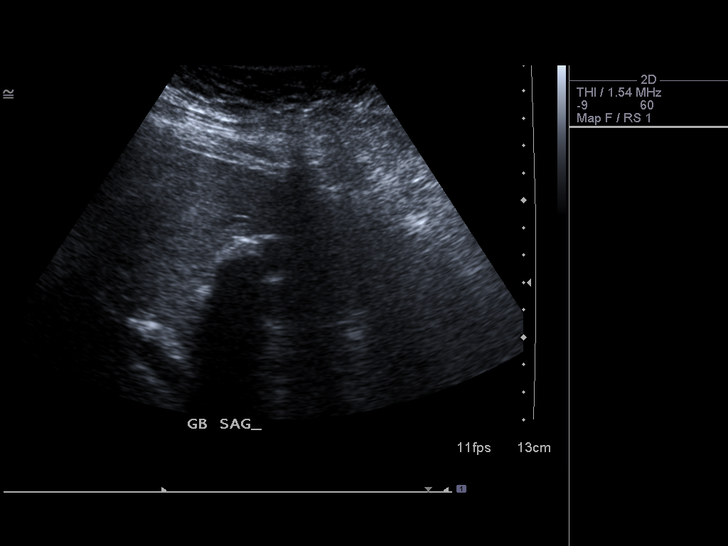
[im 41/76]
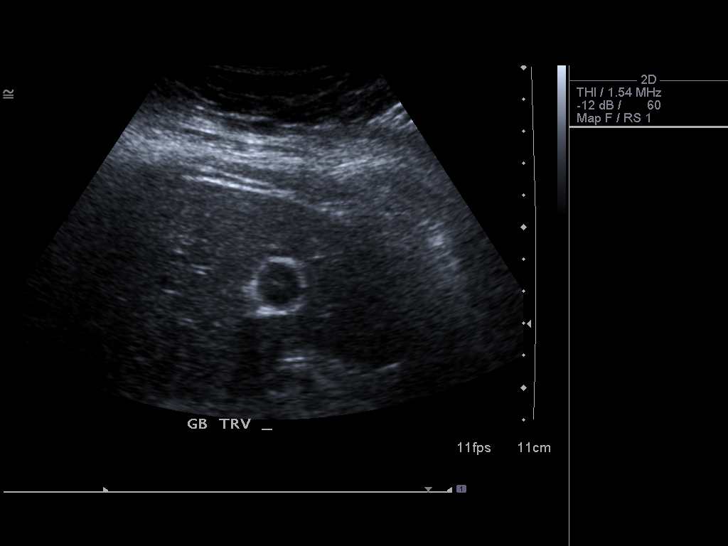
[im 47/76]
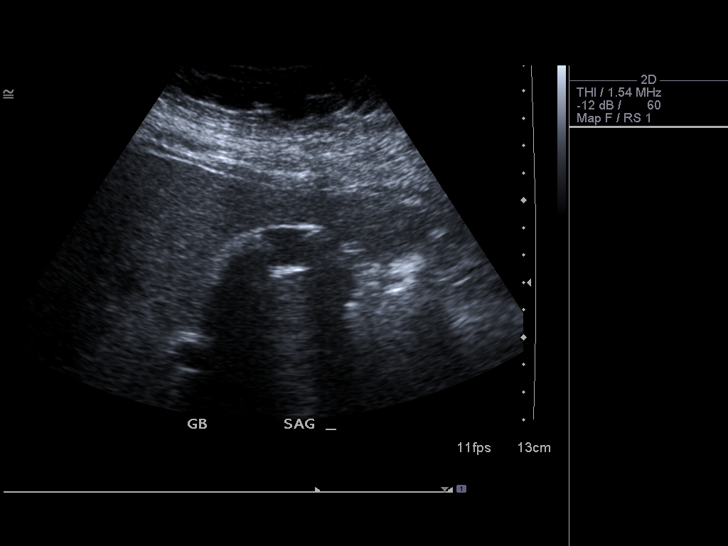
[im 51/76]
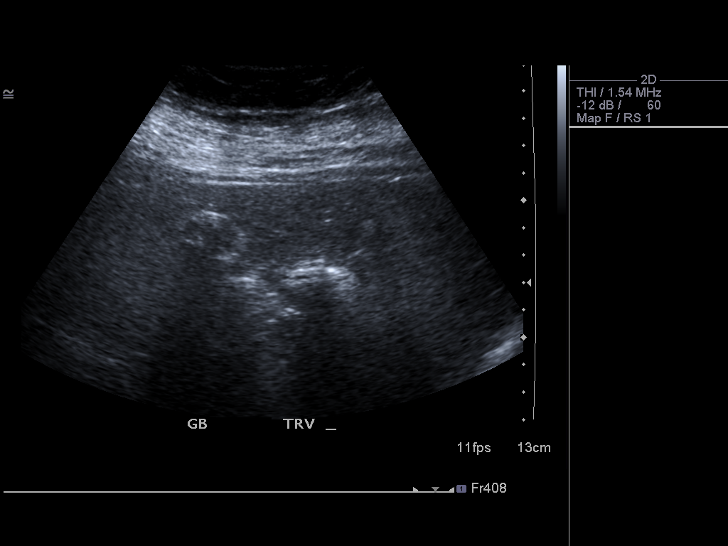
[im 57/76]
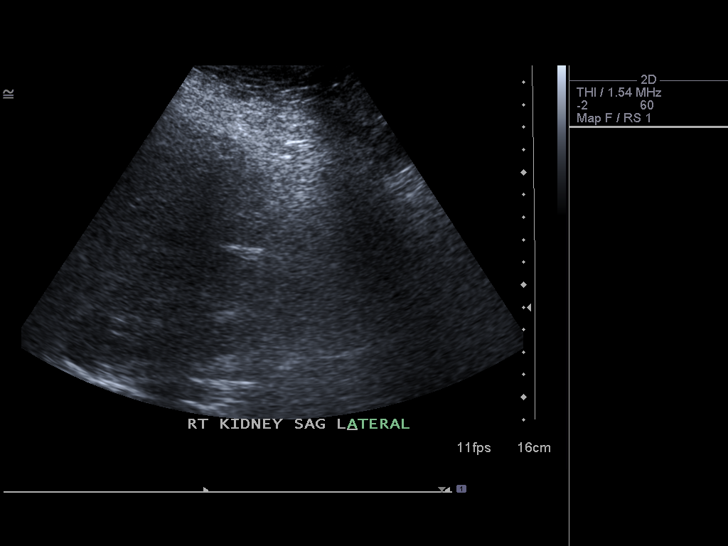
[im 63/76]
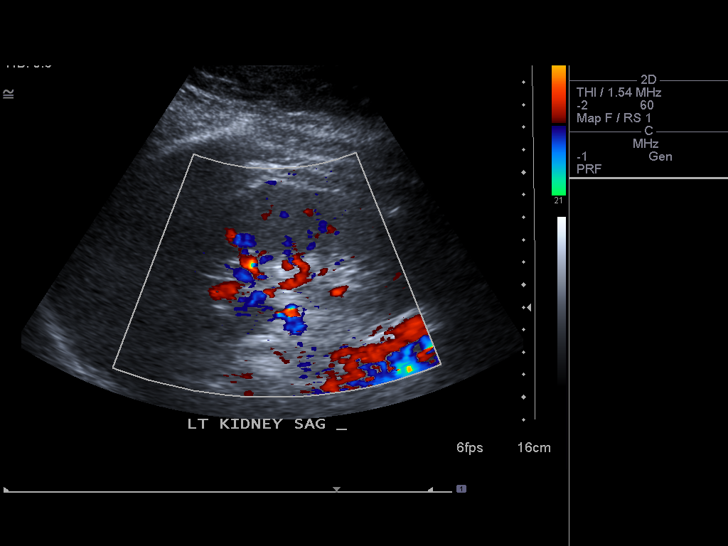
[im 69/76]
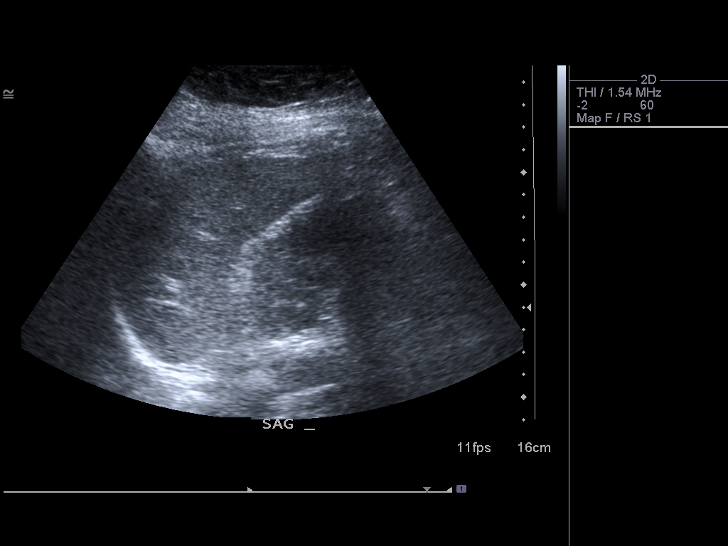
[im 76/76]
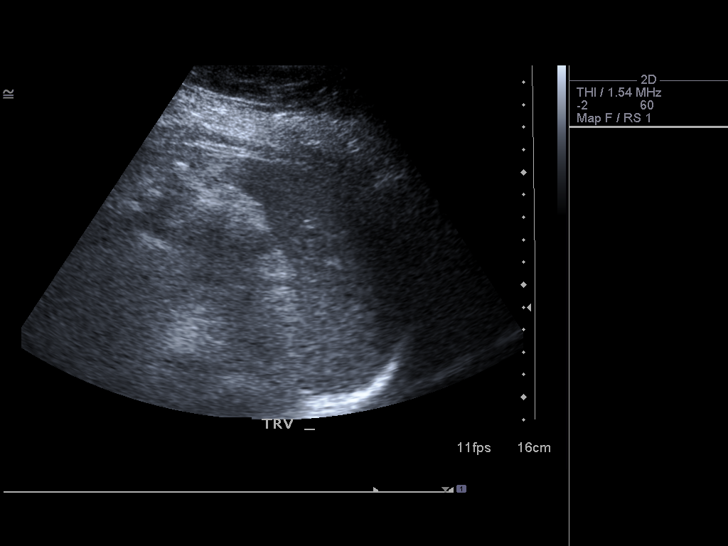

[14 of 25 positions shown; findings below may reference images not displayed]

FINDINGS: Gallbladder:  Shadowing gallstones are present within the
gallbladder.  Gallbladder is contracted.  Borderline gallbladder
wall thickness without edema.  Murphy's sign is positive.  Findings
are nonspecific but consistent with acute cholecystitis in the
appropriate clinical setting.

Common bile duct:  Normal caliber with measured diameter of 2 ml.

Liver:  No focal lesion identified.  Within normal limits in
parenchymal echogenicity.

IVC:  Appears normal.

Pancreas:  Limited visualization of pancreas due to overlying bowel
gas.

Spleen:  Spleen length measures 7.6 cm.  Normal homogeneous
parenchymal echotexture.

Right Kidney:  Right kidney measures 12.1 cm length.  No
hydronephrosis.

Left Kidney:  Left kidney measures 11.5 cm length.  No
hydronephrosis.

Abdominal aorta:  No aneurysm identified.
IMPRESSION: Shadowing stones in the gallbladder with gallbladder contraction
and positive Murphy's sign.  Findings are nonspecific but
consistent with acute cholecystitis in the appropriate clinical
setting.

## 2014-05-27 IMAGING — CR DG CHEST 2V
2 series · 2 of 2 positions shown · non-contrast
Comparison: CT chest 03/30/2006 and plain films of the chest
08/31/2010.

CLINICAL DATA: Preadmission respiratory films.  Shortness of
breath.

CHEST - 2 VIEW

[view not recorded (1 of 2)]
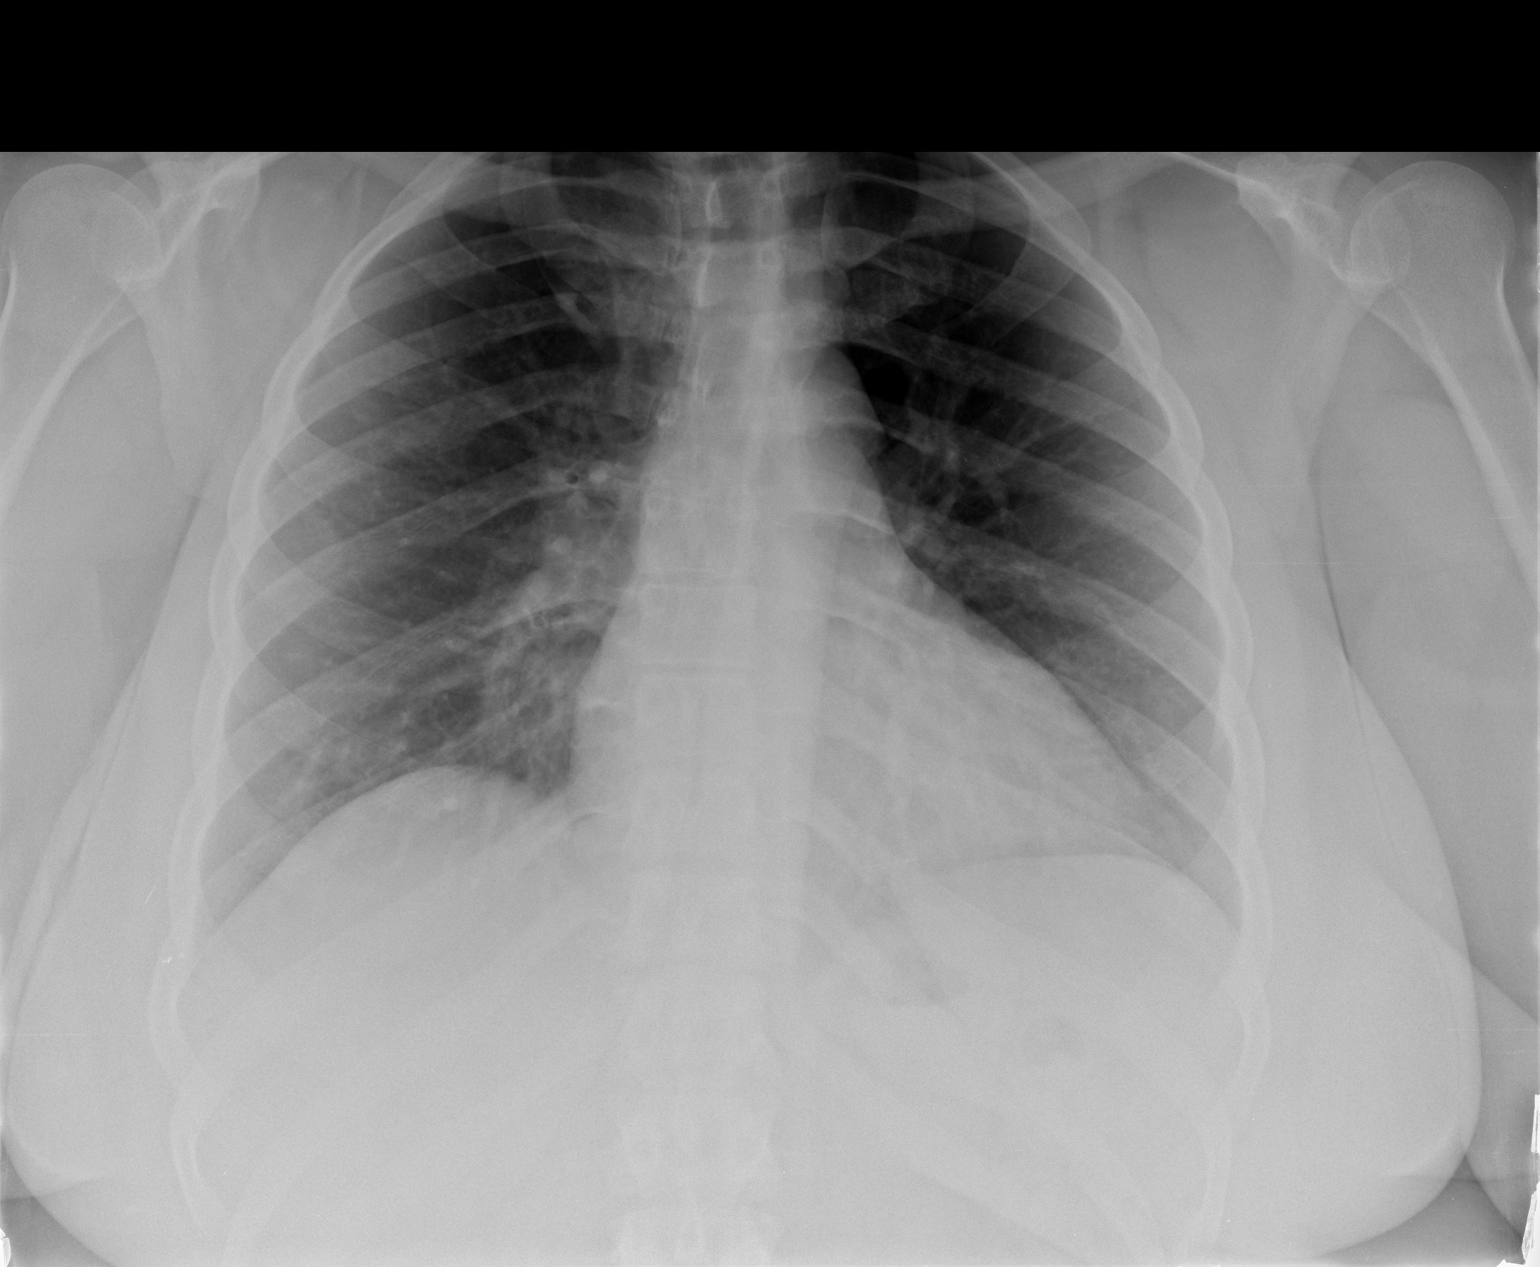

[view not recorded (2 of 2)]
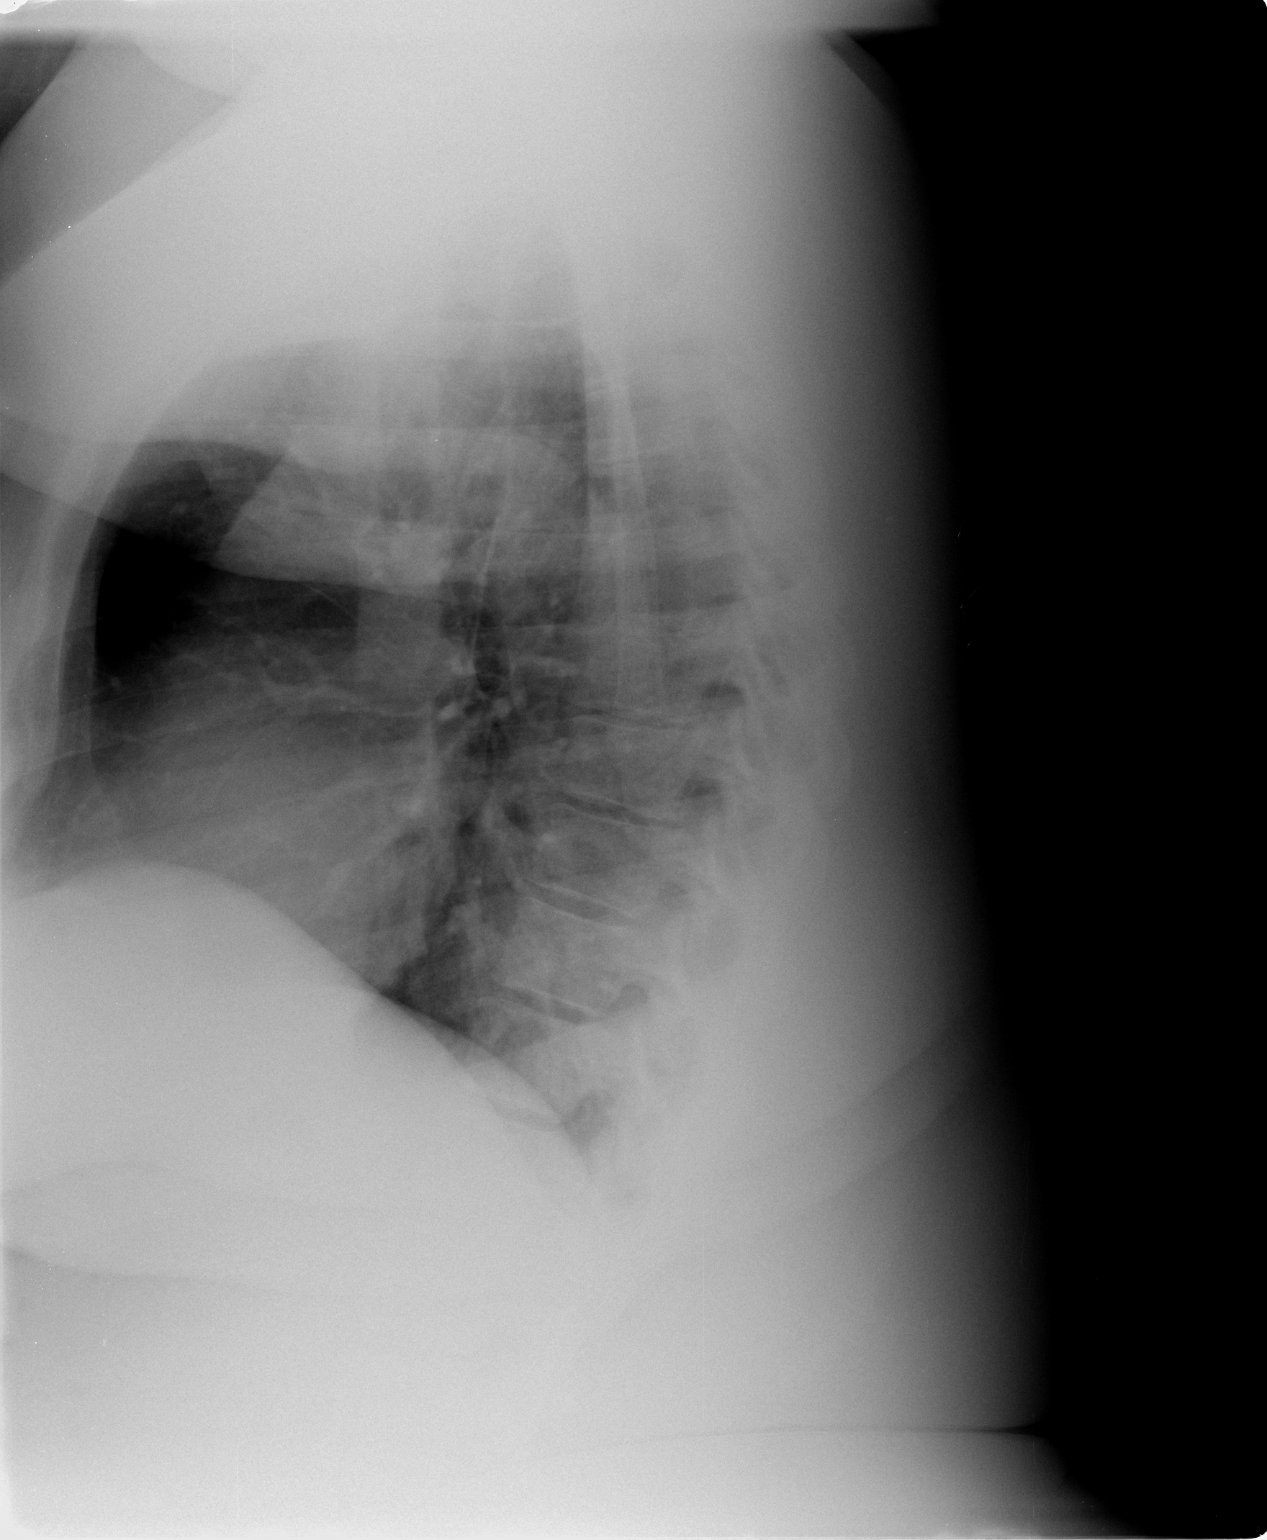

[2 of 2 positions shown; findings below may reference images not displayed]

FINDINGS: The lungs are clear.  Heart size is normal.  No
pneumothorax or pleural effusion.
IMPRESSION: Negative chest.

## 2014-05-28 ENCOUNTER — Encounter (HOSPITAL_COMMUNITY): Payer: Self-pay

## 2014-06-14 ENCOUNTER — Telehealth (HOSPITAL_COMMUNITY): Payer: Self-pay | Admitting: *Deleted

## 2014-06-14 ENCOUNTER — Ambulatory Visit (INDEPENDENT_AMBULATORY_CARE_PROVIDER_SITE_OTHER): Payer: 59 | Admitting: Psychiatry

## 2014-06-14 ENCOUNTER — Encounter (HOSPITAL_COMMUNITY): Payer: Self-pay | Admitting: Psychiatry

## 2014-06-14 VITALS — BP 128/74 | HR 72 | Ht 66.0 in | Wt 379.0 lb

## 2014-06-14 DIAGNOSIS — F1994 Other psychoactive substance use, unspecified with psychoactive substance-induced mood disorder: Secondary | ICD-10-CM

## 2014-06-14 DIAGNOSIS — F1210 Cannabis abuse, uncomplicated: Secondary | ICD-10-CM

## 2014-06-14 DIAGNOSIS — F4310 Post-traumatic stress disorder, unspecified: Secondary | ICD-10-CM

## 2014-06-14 DIAGNOSIS — F39 Unspecified mood [affective] disorder: Secondary | ICD-10-CM

## 2014-06-14 DIAGNOSIS — F1010 Alcohol abuse, uncomplicated: Secondary | ICD-10-CM

## 2014-06-14 MED ORDER — TRAZODONE HCL 100 MG PO TABS: 100.0000 mg | ORAL_TABLET | Freq: Every day | ORAL | Status: DC

## 2014-06-14 MED ORDER — CITALOPRAM HYDROBROMIDE 40 MG PO TABS: 40.0000 mg | ORAL_TABLET | Freq: Two times a day (BID) | ORAL | Status: DC

## 2014-06-14 MED ORDER — HYDROXYZINE PAMOATE 25 MG PO CAPS: ORAL_CAPSULE | ORAL | Status: DC
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
## 2014-06-17 NOTE — Telephone Encounter (Signed)
[facility]phone [facility], pharmacist at [facility] to follow up on directions for patient's Vistaril medication.  Patient to take 1-2 pill daily as needed per instructions.

## 2014-07-17 ENCOUNTER — Ambulatory Visit (HOSPITAL_COMMUNITY): Payer: Self-pay | Admitting: Psychiatry

## 2014-07-24 ENCOUNTER — Ambulatory Visit (INDEPENDENT_AMBULATORY_CARE_PROVIDER_SITE_OTHER): Payer: 59 | Admitting: Psychiatry

## 2014-07-24 ENCOUNTER — Encounter (HOSPITAL_COMMUNITY): Payer: Self-pay | Admitting: Psychiatry

## 2014-07-24 VITALS — BP 147/91 | HR 89 | Ht 66.0 in | Wt 383.0 lb

## 2014-07-24 DIAGNOSIS — F1010 Alcohol abuse, uncomplicated: Secondary | ICD-10-CM

## 2014-07-24 DIAGNOSIS — F1210 Cannabis abuse, uncomplicated: Secondary | ICD-10-CM

## 2014-07-24 DIAGNOSIS — F1994 Other psychoactive substance use, unspecified with psychoactive substance-induced mood disorder: Secondary | ICD-10-CM

## 2014-07-24 DIAGNOSIS — F4310 Post-traumatic stress disorder, unspecified: Secondary | ICD-10-CM

## 2014-07-24 DIAGNOSIS — F39 Unspecified mood [affective] disorder: Secondary | ICD-10-CM

## 2014-07-24 MED ORDER — CITALOPRAM HYDROBROMIDE 40 MG PO TABS: 40.0000 mg | ORAL_TABLET | Freq: Every day | ORAL | Status: DC

## 2014-07-24 MED ORDER — LAMOTRIGINE 25 MG PO TABS: ORAL_TABLET | ORAL | Status: DC
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
## 2014-08-08 ENCOUNTER — Ambulatory Visit (HOSPITAL_COMMUNITY): Payer: Self-pay | Admitting: Clinical

## 2014-08-19 ENCOUNTER — Telehealth (HOSPITAL_COMMUNITY): Payer: Self-pay

## 2014-08-26 ENCOUNTER — Ambulatory Visit (HOSPITAL_COMMUNITY): Payer: Self-pay | Admitting: Psychiatry

## 2014-08-27 ENCOUNTER — Telehealth (HOSPITAL_COMMUNITY): Payer: Self-pay | Admitting: *Deleted

## 2014-08-27 NOTE — Telephone Encounter (Signed)
Dr. Lolly MustacheArfeen,   Kathlene NovemberMike with [employer], who handles disability claims had a question about the severity of patient's diagnosis.  Kathlene NovemberMike requires a call back to discuss patient's diagnosis.  Number to call [phone]

## 2014-08-29 ENCOUNTER — Telehealth (HOSPITAL_COMMUNITY): Payer: Self-pay

## 2014-08-29 ENCOUNTER — Ambulatory Visit (INDEPENDENT_AMBULATORY_CARE_PROVIDER_SITE_OTHER): Payer: 59 | Admitting: Psychiatry

## 2014-08-29 ENCOUNTER — Encounter (HOSPITAL_COMMUNITY): Payer: Self-pay | Admitting: Psychiatry

## 2014-08-29 VITALS — BP 133/84 | HR 75 | Ht 66.0 in | Wt 374.4 lb

## 2014-08-29 DIAGNOSIS — F4310 Post-traumatic stress disorder, unspecified: Secondary | ICD-10-CM | POA: Diagnosis not present

## 2014-08-29 DIAGNOSIS — F1010 Alcohol abuse, uncomplicated: Secondary | ICD-10-CM

## 2014-08-29 DIAGNOSIS — F1210 Cannabis abuse, uncomplicated: Secondary | ICD-10-CM

## 2014-08-29 DIAGNOSIS — F1994 Other psychoactive substance use, unspecified with psychoactive substance-induced mood disorder: Secondary | ICD-10-CM

## 2014-08-29 MED ORDER — TRAZODONE HCL 100 MG PO TABS: 100.0000 mg | ORAL_TABLET | Freq: Every day | ORAL | Status: DC

## 2014-08-29 MED ORDER — CITALOPRAM HYDROBROMIDE 40 MG PO TABS: 40.0000 mg | ORAL_TABLET | Freq: Every day | ORAL | Status: DC

## 2014-08-29 MED ORDER — HYDROXYZINE PAMOATE 25 MG PO CAPS: ORAL_CAPSULE | ORAL | Status: DC

## 2014-08-29 MED ORDER — LAMOTRIGINE 25 MG PO TABS: ORAL_TABLET | ORAL | Status: DC

## 2014-08-29 NOTE — Telephone Encounter (Signed)
Telephone call with Penn[facility]endall at [facility] [place] to verify patient's prescribed Hydroxyzine 25mg  was for 1-2 capsules daily as needed and verification with Dr. Lolly MustacheArfeen.
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
## 2014-09-17 ENCOUNTER — Encounter (HOSPITAL_COMMUNITY): Payer: Self-pay | Admitting: Psychology

## 2014-10-29 ENCOUNTER — Ambulatory Visit (HOSPITAL_COMMUNITY): Payer: Self-pay | Admitting: Psychiatry

## 2015-01-17 ENCOUNTER — Telehealth (HOSPITAL_COMMUNITY): Payer: Self-pay

## 2015-01-17 DIAGNOSIS — F4310 Post-traumatic stress disorder, unspecified: Secondary | ICD-10-CM

## 2015-01-17 MED ORDER — CITALOPRAM HYDROBROMIDE 40 MG PO TABS: 40.0000 mg | ORAL_TABLET | Freq: Every day | ORAL | Status: DC

## 2015-01-17 NOTE — Telephone Encounter (Signed)
Met with Dr. Adele Schilder who approved a one time refill of patient's requested Citalopram 40 mg tablet.  A new one time order e-scribed to patient's [facility] [employer] in [place] as authorized by Dr. Adele Schilder.  Called patient to inform order was e-scribed in but she would need to keep her appointment now set for 02/05/15 to receive any more refills.  Patient stated understanding and will cal if any problems prior to then.

## 2015-02-05 ENCOUNTER — Ambulatory Visit (INDEPENDENT_AMBULATORY_CARE_PROVIDER_SITE_OTHER): Payer: 59 | Admitting: Psychiatry

## 2015-02-05 ENCOUNTER — Encounter (HOSPITAL_COMMUNITY): Payer: Self-pay | Admitting: Psychiatry

## 2015-02-05 VITALS — BP 131/78 | HR 80 | Ht 66.0 in | Wt 383.0 lb

## 2015-02-05 DIAGNOSIS — F4310 Post-traumatic stress disorder, unspecified: Secondary | ICD-10-CM | POA: Diagnosis not present

## 2015-02-05 MED ORDER — ARIPIPRAZOLE 5 MG PO TABS: 5.0000 mg | ORAL_TABLET | Freq: Every day | ORAL | Status: DC

## 2015-02-05 MED ORDER — CITALOPRAM HYDROBROMIDE 40 MG PO TABS: 40.0000 mg | ORAL_TABLET | Freq: Every day | ORAL | Status: DC
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
## 2015-02-10 ENCOUNTER — Telehealth (HOSPITAL_COMMUNITY): Payer: Self-pay | Admitting: *Deleted

## 2015-02-10 NOTE — Telephone Encounter (Signed)
Prior authorization for Abilify received. Called [phone] spoke with Shawna Orleans who gave approval. Case ID #[id number] good until 02/10/16. Notified pharmacy who states it went through but will cost $94. Did mention to them that patient can do the mail order pharmacy through her insurance and she could check to see if it may be cheaper for her. They stated they would let her know.

## 2015-02-11 NOTE — Telephone Encounter (Signed)
noted 

## 2015-02-12 ENCOUNTER — Other Ambulatory Visit (HOSPITAL_COMMUNITY): Payer: Self-pay | Admitting: Psychiatry

## 2015-02-12 NOTE — Telephone Encounter (Signed)
Given on 02/05/15 with refill

## 2015-02-13 ENCOUNTER — Encounter (HOSPITAL_COMMUNITY): Payer: Self-pay | Admitting: Psychiatry

## 2015-02-13 ENCOUNTER — Ambulatory Visit (INDEPENDENT_AMBULATORY_CARE_PROVIDER_SITE_OTHER): Payer: 59 | Admitting: Psychiatry

## 2015-02-13 DIAGNOSIS — F4310 Post-traumatic stress disorder, unspecified: Secondary | ICD-10-CM | POA: Diagnosis not present

## 2015-02-13 DIAGNOSIS — F1010 Alcohol abuse, uncomplicated: Secondary | ICD-10-CM | POA: Diagnosis not present

## 2015-02-13 DIAGNOSIS — F3131 Bipolar disorder, current episode depressed, mild: Secondary | ICD-10-CM | POA: Diagnosis not present

## 2015-02-13 MED ORDER — LAMOTRIGINE 25 MG PO TABS: 25.0000 mg | ORAL_TABLET | Freq: Every day | ORAL | Status: DC

## 2015-02-13 MED ORDER — HYDROXYZINE PAMOATE 25 MG PO CAPS: 100.0000 mg | ORAL_CAPSULE | Freq: Every day | ORAL | Status: DC | PRN
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
## 2015-03-19 ENCOUNTER — Ambulatory Visit (HOSPITAL_COMMUNITY): Payer: Self-pay | Admitting: Psychiatry

## 2015-03-25 ENCOUNTER — Encounter (HOSPITAL_COMMUNITY): Payer: Self-pay | Admitting: Psychology

## 2015-06-07 ENCOUNTER — Other Ambulatory Visit (HOSPITAL_COMMUNITY): Payer: Self-pay | Admitting: Psychiatry

## 2015-06-10 ENCOUNTER — Other Ambulatory Visit (HOSPITAL_COMMUNITY): Payer: Self-pay | Admitting: Psychiatry

## 2015-06-18 ENCOUNTER — Telehealth (HOSPITAL_COMMUNITY): Payer: Self-pay

## 2015-06-18 ENCOUNTER — Other Ambulatory Visit (HOSPITAL_COMMUNITY): Payer: Self-pay | Admitting: Psychiatry

## 2015-06-18 DIAGNOSIS — A609 Anogenital herpesviral infection, unspecified: Secondary | ICD-10-CM | POA: Insufficient documentation

## 2015-06-18 HISTORY — DX: Anogenital herpesviral infection, unspecified: A60.9

## 2015-06-18 NOTE — Telephone Encounter (Signed)
Telephone message left for patient Dr. Lolly Mustache wanted this nurse to call her back after she left a message requesting a new Celexa order, at least enough until new appointment set for 07/09/15.  Informed on message Dr. Lolly Mustache had not seen since 02/13/16 and would not refill any medication until patient is evaluated as she was given a new Celexa order 02/05/15 plus one refill so should have been out previously if taking correctly.  Informed patient on message to call back if any questions but would need to keep new appointment set for 07/09/15 for any new orders.

## 2015-07-09 ENCOUNTER — Ambulatory Visit (HOSPITAL_COMMUNITY): Payer: Self-pay | Admitting: Psychiatry

## 2015-11-21 DIAGNOSIS — F3181 Bipolar II disorder: Secondary | ICD-10-CM

## 2015-11-21 DIAGNOSIS — Z86711 Personal history of pulmonary embolism: Secondary | ICD-10-CM | POA: Insufficient documentation

## 2015-11-21 DIAGNOSIS — G4700 Insomnia, unspecified: Secondary | ICD-10-CM | POA: Insufficient documentation

## 2015-11-21 HISTORY — DX: Bipolar II disorder: F31.81

## 2015-11-21 HISTORY — DX: Morbid (severe) obesity due to excess calories: E66.01

## 2018-02-20 ENCOUNTER — Ambulatory Visit (INDEPENDENT_AMBULATORY_CARE_PROVIDER_SITE_OTHER): Payer: 59 | Admitting: Licensed Clinical Social Worker

## 2018-02-20 DIAGNOSIS — F1220 Cannabis dependence, uncomplicated: Secondary | ICD-10-CM

## 2018-02-20 DIAGNOSIS — F3181 Bipolar II disorder: Secondary | ICD-10-CM
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
## 2018-02-26 ENCOUNTER — Telehealth (HOSPITAL_COMMUNITY): Payer: Self-pay | Admitting: Psychology

## 2018-03-01 ENCOUNTER — Other Ambulatory Visit (HOSPITAL_COMMUNITY): Payer: 59 | Attending: Psychiatry | Admitting: Psychology

## 2018-03-01 ENCOUNTER — Encounter (HOSPITAL_COMMUNITY): Payer: Self-pay | Admitting: Psychology

## 2018-03-01 DIAGNOSIS — Z813 Family history of other psychoactive substance abuse and dependence: Secondary | ICD-10-CM | POA: Insufficient documentation

## 2018-03-01 DIAGNOSIS — F1421 Cocaine dependence, in remission: Secondary | ICD-10-CM | POA: Insufficient documentation

## 2018-03-01 DIAGNOSIS — Z823 Family history of stroke: Secondary | ICD-10-CM | POA: Insufficient documentation

## 2018-03-01 DIAGNOSIS — Z818 Family history of other mental and behavioral disorders: Secondary | ICD-10-CM | POA: Insufficient documentation

## 2018-03-01 DIAGNOSIS — F1010 Alcohol abuse, uncomplicated: Secondary | ICD-10-CM | POA: Insufficient documentation

## 2018-03-01 DIAGNOSIS — F900 Attention-deficit hyperactivity disorder, predominantly inattentive type: Secondary | ICD-10-CM | POA: Insufficient documentation

## 2018-03-01 DIAGNOSIS — Z79899 Other long term (current) drug therapy: Secondary | ICD-10-CM | POA: Insufficient documentation

## 2018-03-01 DIAGNOSIS — A609 Anogenital herpesviral infection, unspecified: Secondary | ICD-10-CM | POA: Insufficient documentation

## 2018-03-01 DIAGNOSIS — Z6841 Body Mass Index (BMI) 40.0 and over, adult: Secondary | ICD-10-CM | POA: Insufficient documentation

## 2018-03-01 DIAGNOSIS — F1210 Cannabis abuse, uncomplicated: Secondary | ICD-10-CM | POA: Insufficient documentation

## 2018-03-01 DIAGNOSIS — F4312 Post-traumatic stress disorder, chronic: Secondary | ICD-10-CM | POA: Insufficient documentation

## 2018-03-01 DIAGNOSIS — I129 Hypertensive chronic kidney disease with stage 1 through stage 4 chronic kidney disease, or unspecified chronic kidney disease: Secondary | ICD-10-CM | POA: Insufficient documentation

## 2018-03-01 DIAGNOSIS — G4700 Insomnia, unspecified: Secondary | ICD-10-CM | POA: Insufficient documentation

## 2018-03-01 DIAGNOSIS — Z880 Allergy status to penicillin: Secondary | ICD-10-CM | POA: Insufficient documentation

## 2018-03-01 DIAGNOSIS — F1994 Other psychoactive substance use, unspecified with psychoactive substance-induced mood disorder: Secondary | ICD-10-CM | POA: Insufficient documentation

## 2018-03-01 DIAGNOSIS — F3181 Bipolar II disorder: Secondary | ICD-10-CM

## 2018-03-01 DIAGNOSIS — Z8249 Family history of ischemic heart disease and other diseases of the circulatory system: Secondary | ICD-10-CM | POA: Insufficient documentation

## 2018-03-01 DIAGNOSIS — F1320 Sedative, hypnotic or anxiolytic dependence, uncomplicated: Secondary | ICD-10-CM | POA: Insufficient documentation

## 2018-03-01 DIAGNOSIS — F1220 Cannabis dependence, uncomplicated: Secondary | ICD-10-CM

## 2018-03-01 DIAGNOSIS — Z86711 Personal history of pulmonary embolism: Secondary | ICD-10-CM | POA: Insufficient documentation

## 2018-03-02 ENCOUNTER — Other Ambulatory Visit (HOSPITAL_BASED_OUTPATIENT_CLINIC_OR_DEPARTMENT_OTHER): Payer: 59 | Admitting: Psychology

## 2018-03-02 DIAGNOSIS — Z813 Family history of other psychoactive substance abuse and dependence: Secondary | ICD-10-CM | POA: Diagnosis not present

## 2018-03-02 DIAGNOSIS — F1994 Other psychoactive substance use, unspecified with psychoactive substance-induced mood disorder: Secondary | ICD-10-CM | POA: Diagnosis not present

## 2018-03-02 DIAGNOSIS — F4312 Post-traumatic stress disorder, chronic: Secondary | ICD-10-CM | POA: Diagnosis not present

## 2018-03-02 DIAGNOSIS — F1010 Alcohol abuse, uncomplicated: Secondary | ICD-10-CM | POA: Diagnosis not present

## 2018-03-02 DIAGNOSIS — Z86711 Personal history of pulmonary embolism: Secondary | ICD-10-CM | POA: Diagnosis not present

## 2018-03-02 DIAGNOSIS — F1320 Sedative, hypnotic or anxiolytic dependence, uncomplicated: Secondary | ICD-10-CM | POA: Diagnosis not present

## 2018-03-02 DIAGNOSIS — Z8249 Family history of ischemic heart disease and other diseases of the circulatory system: Secondary | ICD-10-CM | POA: Diagnosis not present

## 2018-03-02 DIAGNOSIS — A609 Anogenital herpesviral infection, unspecified: Secondary | ICD-10-CM | POA: Diagnosis not present

## 2018-03-02 DIAGNOSIS — Z823 Family history of stroke: Secondary | ICD-10-CM | POA: Diagnosis not present

## 2018-03-02 DIAGNOSIS — F900 Attention-deficit hyperactivity disorder, predominantly inattentive type: Secondary | ICD-10-CM | POA: Diagnosis not present

## 2018-03-02 DIAGNOSIS — F1421 Cocaine dependence, in remission: Secondary | ICD-10-CM | POA: Diagnosis not present

## 2018-03-02 DIAGNOSIS — Z880 Allergy status to penicillin: Secondary | ICD-10-CM | POA: Diagnosis not present

## 2018-03-02 DIAGNOSIS — Z79899 Other long term (current) drug therapy: Secondary | ICD-10-CM | POA: Diagnosis not present

## 2018-03-02 DIAGNOSIS — F1210 Cannabis abuse, uncomplicated: Secondary | ICD-10-CM | POA: Diagnosis not present

## 2018-03-02 DIAGNOSIS — Z818 Family history of other mental and behavioral disorders: Secondary | ICD-10-CM | POA: Diagnosis not present

## 2018-03-02 DIAGNOSIS — Z6841 Body Mass Index (BMI) 40.0 and over, adult: Secondary | ICD-10-CM | POA: Diagnosis not present

## 2018-03-02 DIAGNOSIS — G4700 Insomnia, unspecified: Secondary | ICD-10-CM | POA: Diagnosis not present

## 2018-03-02 DIAGNOSIS — F3181 Bipolar II disorder: Secondary | ICD-10-CM | POA: Diagnosis not present

## 2018-03-02 DIAGNOSIS — F1220 Cannabis dependence, uncomplicated: Secondary | ICD-10-CM | POA: Diagnosis not present

## 2018-03-02 DIAGNOSIS — I129 Hypertensive chronic kidney disease with stage 1 through stage 4 chronic kidney disease, or unspecified chronic kidney disease: Secondary | ICD-10-CM | POA: Diagnosis not present

## 2018-03-06 ENCOUNTER — Other Ambulatory Visit (INDEPENDENT_AMBULATORY_CARE_PROVIDER_SITE_OTHER): Payer: 59 | Admitting: Psychology

## 2018-03-06 ENCOUNTER — Encounter (HOSPITAL_COMMUNITY): Payer: Self-pay | Admitting: Medical

## 2018-03-06 ENCOUNTER — Encounter (HOSPITAL_COMMUNITY): Payer: Self-pay | Admitting: Psychology

## 2018-03-06 ENCOUNTER — Telehealth (HOSPITAL_COMMUNITY): Payer: Self-pay

## 2018-03-06 ENCOUNTER — Encounter (HOSPITAL_COMMUNITY): Payer: Self-pay

## 2018-03-06 VITALS — BP 120/68 | HR 80 | Ht 66.0 in | Wt 375.0 lb

## 2018-03-06 DIAGNOSIS — F1421 Cocaine dependence, in remission: Secondary | ICD-10-CM

## 2018-03-06 DIAGNOSIS — F1220 Cannabis dependence, uncomplicated: Secondary | ICD-10-CM | POA: Insufficient documentation

## 2018-03-06 DIAGNOSIS — F1321 Sedative, hypnotic or anxiolytic dependence, in remission: Secondary | ICD-10-CM

## 2018-03-06 DIAGNOSIS — Z6841 Body Mass Index (BMI) 40.0 and over, adult: Secondary | ICD-10-CM

## 2018-03-06 DIAGNOSIS — F1210 Cannabis abuse, uncomplicated: Secondary | ICD-10-CM | POA: Diagnosis not present

## 2018-03-06 DIAGNOSIS — F1994 Other psychoactive substance use, unspecified with psychoactive substance-induced mood disorder: Secondary | ICD-10-CM

## 2018-03-06 DIAGNOSIS — F1020 Alcohol dependence, uncomplicated: Secondary | ICD-10-CM

## 2018-03-06 DIAGNOSIS — F3181 Bipolar II disorder: Secondary | ICD-10-CM

## 2018-03-06 DIAGNOSIS — A609 Anogenital herpesviral infection, unspecified: Secondary | ICD-10-CM

## 2018-03-06 DIAGNOSIS — F900 Attention-deficit hyperactivity disorder, predominantly inattentive type: Secondary | ICD-10-CM

## 2018-03-06 DIAGNOSIS — J45909 Unspecified asthma, uncomplicated: Secondary | ICD-10-CM | POA: Insufficient documentation

## 2018-03-06 DIAGNOSIS — F4312 Post-traumatic stress disorder, chronic: Secondary | ICD-10-CM

## 2018-03-06 DIAGNOSIS — G4700 Insomnia, unspecified: Secondary | ICD-10-CM

## 2018-03-06 DIAGNOSIS — IMO0001 Reserved for inherently not codable concepts without codable children: Secondary | ICD-10-CM

## 2018-03-06 DIAGNOSIS — Z813 Family history of other psychoactive substance abuse and dependence: Secondary | ICD-10-CM

## 2018-03-06 DIAGNOSIS — Z86711 Personal history of pulmonary embolism: Secondary | ICD-10-CM

## 2018-03-06 DIAGNOSIS — F419 Anxiety disorder, unspecified: Secondary | ICD-10-CM

## 2018-03-06 DIAGNOSIS — Z818 Family history of other mental and behavioral disorders: Secondary | ICD-10-CM

## 2018-03-06 MED ORDER — PREGABALIN 150 MG PO CAPS: 150.0000 mg | ORAL_CAPSULE | Freq: Three times a day (TID) | ORAL | 2 refills | Status: DC

## 2018-03-06 MED ORDER — BUPROPION HCL ER (SR) 150 MG PO TB12: 150.0000 mg | ORAL_TABLET | Freq: Two times a day (BID) | ORAL | 2 refills | Status: DC

## 2018-03-06 MED ORDER — TRAZODONE HCL 50 MG PO TABS: 50.0000 mg | ORAL_TABLET | Freq: Every evening | ORAL | 2 refills | Status: DC | PRN

## 2018-03-06 MED ORDER — ATOMOXETINE HCL 60 MG PO CAPS: 60.0000 mg | ORAL_CAPSULE | Freq: Every day | ORAL | 2 refills | Status: DC
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
## 2018-03-06 NOTE — Addendum Note (Signed)
Addended byLogan Bores, Katieann Hungate on: 03/06/2018 09:45 AM   Modules accepted: Level of Service
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
## 2018-03-06 NOTE — Progress Notes (Signed)
    Daily Group Progress Note  Program: CD-IOP   03/06/2018 Alexis Mccullough [id number]  Diagnosis:  Cannabis use disorder, severe, dependence (HCC)   Sobriety Date: 10/22  Group Time: 1-2:30  Participation Level: Active  Behavioral Response: Appropriate, Sharing and Monopolizing  Type of Therapy: Process Group  Interventions: CBT  Topic: Pts were active and engaged in group processing session. Counselor facilitated CBT-based processing questions to help pts discuss their recovery from mind-altering drugs/alcohol. Emphasis was placed on pts disclosing their challenges and successes pertaining to their treatment plan goals. One female member graduated and pts shared their hopes and challenges for her going forward in recovery.       Group Time: 2:30-4  Participation Level: Active  Behavioral Response: Appropriate and Sharing  Type of Therapy: Psycho-education Group  Interventions: CBT  Topic: Patient were active and engaged in psychoeducation session in which counselors led a discussion based on recovery and 12 Step topics chosen at random on a popsickle stick. Pts discussed medications, step work, loneliness and other topics. Counselors emphasized pt's sharing information w/ each other and encouraging each other to discover answers for themselves.    Summary: Pt was active, engaged, loquacious, and tearful during session. She reported attending 1 AA meeting since yesterday at 6:30AM. She likes AA better than NA even though she is addicted to marijuana. Pt was asked how much she smoked and admitted to smoking throughout the day. Pt reported she is best friends w/ a woman who helps lead a "trap house" (drug house) and this creates an inner conflict for pt. Pt was encouraged to distance herself from users since her brain is so early in the recovery process. Pt reported her main reason to get sober is to "learn more about her mother and her mother's illness". She wants to try  to make amends w/ her mother. Pt shared on the topic of "faith", having a preacher for a father, and losing her religion. She is becoming more interested in church again and states she feels a "longing for it in her soul".    UDS collected: No Results:   AA/NA attended?: YesThursday  Sponsor?: No   Dorann Lodge, LPCA LCASA 03/06/2018 8:37 AM

## 2018-03-08 ENCOUNTER — Other Ambulatory Visit (HOSPITAL_COMMUNITY): Payer: 59 | Admitting: Psychology

## 2018-03-08 ENCOUNTER — Encounter: Payer: Self-pay | Admitting: Medical

## 2018-03-08 DIAGNOSIS — F1210 Cannabis abuse, uncomplicated: Secondary | ICD-10-CM

## 2018-03-08 DIAGNOSIS — F1220 Cannabis dependence, uncomplicated: Secondary | ICD-10-CM

## 2018-03-08 DIAGNOSIS — F1010 Alcohol abuse, uncomplicated: Secondary | ICD-10-CM

## 2018-03-08 NOTE — Telephone Encounter (Signed)
Medication was sent in by [facility]

## 2018-03-09 ENCOUNTER — Other Ambulatory Visit (HOSPITAL_COMMUNITY): Payer: 59 | Admitting: Psychology

## 2018-03-09 DIAGNOSIS — F1210 Cannabis abuse, uncomplicated: Secondary | ICD-10-CM | POA: Diagnosis not present

## 2018-03-09 DIAGNOSIS — F4312 Post-traumatic stress disorder, chronic: Secondary | ICD-10-CM

## 2018-03-09 DIAGNOSIS — F1220 Cannabis dependence, uncomplicated: Secondary | ICD-10-CM

## 2018-03-09 DIAGNOSIS — F1010 Alcohol abuse, uncomplicated: Secondary | ICD-10-CM

## 2018-03-09 DIAGNOSIS — F1421 Cocaine dependence, in remission: Secondary | ICD-10-CM

## 2018-03-10 ENCOUNTER — Encounter (HOSPITAL_COMMUNITY): Payer: Self-pay

## 2018-03-10 NOTE — Progress Notes (Signed)
    Daily Group Progress Note  Program: CD-IOP   03/10/2018 Alexis Mccullough [id number]  Diagnosis:  Cannabis abuse with physiological dependence  Cannabis use disorder, severe, dependence ([place])  Alcohol abuse, continuous drinking behavior   Sobriety Date: 03/05/2018  Group Time: 1-2:30pm  Participation Level: Active  Behavioral Response: Sharing  Type of Therapy: Process Group  Interventions: Supportive  Topic: Process: The first part of group began with process. Group members shared their experiences in early recovery since we last met on Monday. The Medical Director met with three people and preformed one discharge plan, one initial treatment plan, and a medication check. One drug test was collected. Another CD-IOP counselor met with one group member to complete their treatment plan.      Group Time: 2:30-4pm  Participation Level: Active  Behavioral Response: Sharing  Type of Therapy: Psycho-education Group  Interventions: Systems analyst  Topic: Psycho-ed; Emotional Buttons: The second half of group was spent in psycho-ed discussing emotional buttons. Group members we presented with a handout about the different types of emotional buttons. The group took turns reading each of the buttons and processing which buttons were triggers for them. Group members shared freely in this part of the group and challenged each other about the underlying causes for their buttons.    Summary: The patient reported that she attended one [place] meeting since we last met on Monday. A drug test was collected from the patient. The patient reported that on Tuesday morning she met with another CD-IOP counselor and it went "really good." The patient reported that she talked about her anger and "bitch baby" crying. The patient reported that she does not like crying and lately she has been crying a lot. The patient shared that she attended the [facility] meeting in [place]. She talked about a  man that was there who was struggling and this helped solidify her need to be in this group. The patient also talked about struggles she is facing with being her mother's primary caretaker. The patient reported her mother refuses to admit that she has memory issues. This is causing concern for the patient because she is scared to leave her mother alone while she is at group. The patient processed that discontent she feels towards her sisters because they are not helping her care for their mother. She processed the frustration of wanting a relationship with them but feeling "pissed off." The patient reported that she is meeting with her sister on Saturday to discuss assisted living for their mother. During the psycho-ed portion of group, the patient was able to identify the "control button" as her biggest emotional button. The patient stated she does not like being told what to do and desires to feel in control of situations around her.   UDS collected: Yes   AA/NA attended?: Yes Tuesday  Sponsor?: No   Brandon Melnick, LCAS 03/10/2018 12:04 PM

## 2018-03-10 NOTE — Progress Notes (Signed)
Patient ID: Alexis Mccullough, female   DOB: Apr 25, 1981, 37 y.o.   MRN: [id number]     Daily Group Progress Note  Program: CD-IOP   03/10/2018 [place] N Berhe [id number]  Diagnosis:  Cannabis abuse with physiological dependence  Alcoholism /alcohol abuse ([place])  Other psychoactive substance-induced mood disorder ([place])  Cocaine dependence in remission ([place])  Sedative, hypnotic or anxiolytic dependence, in remission ([place])  Chronic post-traumatic stress disorder (PTSD)  Bipolar 2 disorder ([place])  Chronic anxiety  Attention deficit hyperactivity disorder (ADHD), predominantly inattentive type  Insomnia, unspecified type  Morbid obesity with BMI of 60.0-69.9, adult ([place])  HSV (herpes simplex virus) anogenital infection  Family history of drug addiction  Family history of mental disorder  History of pulmonary embolism   Sobriety Date: 10/27  Group Time: 1-2:30  Participation Level: Active  Behavioral Response: Appropriate, Sharing and Assertive  Type of Therapy: Process Group  Interventions: CBT  Topic: Pts were active and engaged in group processing session. Counselor facilitated CBT-based processing questions to help pts discuss their recovery from mind-altering drugs/alcohol. Emphasis was placed on pts disclosing their challenges and successes pertaining to their treatment plan goals. UDS samples were collected from multiple members. Medical Director met w/ multiple members. One new famale member was present for first session.      Group Time: 2:30-4  Participation Level: Active  Behavioral Response: Appropriate and Sharing  Type of Therapy: Psycho-education Group  Interventions: Other: Art Therapy  Topic:  Patient were active and engaged in art therapy session in which pt's designed and created paper masks that represented their "inside and outside selves". Pt's disclosed their thoughts and feelings on the inside vs. their outward portrayl of themselves  to others. Most Pt's were talkative and open about sharing their masks. Many members stated this helped them learn more about how they are incongruent and want to make changes to get healthier.     Summary: Pt was active and engaged in session, made strong eye contact, and spoke to everyone during their check in. She reported she smoke weed and drank alcohol over weekend and "did not have a good plan". She admitted she attended [employer] which is well known for being a large party. She believed her friends would "just drink" but it turned out they brought weed too. She reports she did not even consider not smoking when they passed her a blunt. Pt then realized her commitment to CD-IOP and sobriety but stated she thought she could "Start sobriety the following day". She admitted she drank more than normal and felt guilty about using while in the program. Pt was open and honest about relapse and did not require any encouragement from counselors to share. Pt was active in making her mask and stated she often thinks about how fat she is but she doesn't like to talk about it. Pt's outside of mask was encouraging and positive while her inside was toxic and belittling. Pt met w/ medical director to discuss medications for cravings and mood.    UDS collected: No Results:   AA/NA attended?: YesThursday and Friday  Sponsor?: No   Youlanda Roys, LPCA LCASA 03/10/2018 11:31 AM

## 2018-03-12 ENCOUNTER — Encounter (HOSPITAL_COMMUNITY): Payer: Self-pay | Admitting: Psychology

## 2018-03-12 NOTE — Progress Notes (Signed)
    Daily Group Progress Note  Program: CD-IOP   03/12/2018 Alexis Mccullough [id number]  Diagnosis: Cannabis Use disorder, severe  Sobriety Date: 03/05/18  Group Time: 1-2:30pm  Participation Level: Active  Behavioral Response: Sharing  Type of Therapy: Process Group  Interventions: Supportive  Topic: Process: The first half of group was spent in process. Members shared about any challenges, struggles or successes they had experienced in early recovery. We met yesterday, so not much time has passed since we were together. The conversation was lively and include members questioning, challenging and supporting each other with their specific challenges. This part of group was everything a counselor hopes to have occur in a free-flowing group session with members asking the questions.   Group Time: 2:30-4pm  Participation Level: Active  Behavioral Response: Sharing  Type of Therapy: Psycho-education Group  Interventions: Strength-based  Topic: Psychoeducation: Guest speaker on 'Self Care"/Graduation. The second half of group included a visit from the Mudlogger of the [name], [place]. She presented a slide show on self-care that included dealing with stress via diet, exercise, proper sleep and other elements promoting self-care. Group members responded to her presentation with insightful questions and revelations about their own efforts to be healthier in their daily lives. At the end of this presentation, a graduation ceremony was held honoring a Software engineer. Guests came for this event, including the patient's parents and a very close friend. There were very kind words shared honoring the graduating member and wishing him well in this next stage of his new life. The session was powerful with everyone engaged and contributing in the session.  Summary: The patient was engaged and offered helpful feedback to her fellow group member. "Maybe your expectations  are too high", she suggested to the young woman. The patient reported her mother's cognitive problems are getting to be overwhelming and she admitted she isn't sure she can continue to provide the appropriate level of care. The patient reported she was driving to [place] this weekend to meet with her 'mentor' who helps her and other 'wounded women' who are struggling with various issues. It should be a very powerful and validating weekend for this patient. She reported she had gotten her hair done and other group members noted how good it looked. The patient has worn a baseball hat every session since she began group and admitted that was not her and this disheveled look is a symptom of her depression and drug use. In the psycho-ed, the patient shared that she has used the keto diet and lost considerable amounts of weight in the past. She is beginning it today again. She also admitted she doesn't sleep as much as she would like to, but then again, "I slept all the time when I was using". The patient shared kind words with the graduating member and wished him well. She made some insightful comments and responded well to this intervention.    UDS collected: No   AA/NA attended?: No  Sponsor?: No   Brandon Melnick, LCAS 03/12/2018 9:40 AM

## 2018-03-13 ENCOUNTER — Other Ambulatory Visit (HOSPITAL_COMMUNITY): Payer: 59

## 2018-03-15 ENCOUNTER — Other Ambulatory Visit (HOSPITAL_COMMUNITY): Payer: 59 | Attending: Psychiatry | Admitting: Medical

## 2018-03-15 ENCOUNTER — Encounter: Payer: Self-pay | Admitting: Medical

## 2018-03-15 ENCOUNTER — Encounter (HOSPITAL_COMMUNITY): Payer: Self-pay | Admitting: Medical

## 2018-03-15 DIAGNOSIS — A609 Anogenital herpesviral infection, unspecified: Secondary | ICD-10-CM | POA: Diagnosis not present

## 2018-03-15 DIAGNOSIS — F419 Anxiety disorder, unspecified: Secondary | ICD-10-CM

## 2018-03-15 DIAGNOSIS — Z6841 Body Mass Index (BMI) 40.0 and over, adult: Secondary | ICD-10-CM | POA: Insufficient documentation

## 2018-03-15 DIAGNOSIS — F4312 Post-traumatic stress disorder, chronic: Secondary | ICD-10-CM

## 2018-03-15 DIAGNOSIS — Z79899 Other long term (current) drug therapy: Secondary | ICD-10-CM | POA: Insufficient documentation

## 2018-03-15 DIAGNOSIS — Z793 Long term (current) use of hormonal contraceptives: Secondary | ICD-10-CM | POA: Diagnosis not present

## 2018-03-15 DIAGNOSIS — Z7901 Long term (current) use of anticoagulants: Secondary | ICD-10-CM | POA: Insufficient documentation

## 2018-03-15 DIAGNOSIS — F1321 Sedative, hypnotic or anxiolytic dependence, in remission: Secondary | ICD-10-CM

## 2018-03-15 DIAGNOSIS — F1010 Alcohol abuse, uncomplicated: Secondary | ICD-10-CM | POA: Diagnosis not present

## 2018-03-15 DIAGNOSIS — Z813 Family history of other psychoactive substance abuse and dependence: Secondary | ICD-10-CM

## 2018-03-15 DIAGNOSIS — F1320 Sedative, hypnotic or anxiolytic dependence, uncomplicated: Secondary | ICD-10-CM | POA: Insufficient documentation

## 2018-03-15 DIAGNOSIS — F1210 Cannabis abuse, uncomplicated: Secondary | ICD-10-CM

## 2018-03-15 DIAGNOSIS — F3181 Bipolar II disorder: Secondary | ICD-10-CM | POA: Insufficient documentation

## 2018-03-15 DIAGNOSIS — F1421 Cocaine dependence, in remission: Secondary | ICD-10-CM | POA: Insufficient documentation

## 2018-03-15 DIAGNOSIS — Z86711 Personal history of pulmonary embolism: Secondary | ICD-10-CM | POA: Diagnosis not present

## 2018-03-15 DIAGNOSIS — Z818 Family history of other mental and behavioral disorders: Secondary | ICD-10-CM | POA: Diagnosis not present

## 2018-03-15 DIAGNOSIS — F1220 Cannabis dependence, uncomplicated: Secondary | ICD-10-CM

## 2018-03-15 DIAGNOSIS — F900 Attention-deficit hyperactivity disorder, predominantly inattentive type: Secondary | ICD-10-CM | POA: Diagnosis not present

## 2018-03-15 DIAGNOSIS — G4700 Insomnia, unspecified: Secondary | ICD-10-CM | POA: Diagnosis not present

## 2018-03-15 MED ORDER — VALACYCLOVIR HCL 500 MG PO TABS: ORAL_TABLET | ORAL | 1 refills | Status: AC

## 2018-03-15 MED ORDER — VALACYCLOVIR HCL 500 MG PO TABS: 500.0000 mg | ORAL_TABLET | Freq: Every day | ORAL | 0 refills | Status: DC

## 2018-03-15 NOTE — Progress Notes (Addendum)
[facility] Follow-up Outpatient Visit   Date: 03/15/2018  Admission Date: 03/01/2018  Sobriety date: 03/05/2018  Subjective: " When can I get back to work. I have out break of herpes-I haven't had one in 4 years?"   HPI: Pt seen today for CD IOP Provider visit and medication management. She also c/o outbreak of Herpes for first time in 4 years.Admits that she was stressed by trip to Nursing home visit for placing her mom. 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 She is having dysphoric  reaction to taking Straterra with Wellbutrin.   Review of Systems: Psychiatric: Agitation: Some Hallucination: No Depressed Mood: Yes Slight improvement on PHQ 9 from 26 to 21 Insomnia: Rx Trazodone Hypersomnia: No Altered Concentration:ADHD Feels Worthless: Yes-places her value on her work now [employer]: Some unrealistic expectations Belief In Special Powers: No New/Increased Substance Abuse: Used in treatment Compulsions: No  Neurologic: c/o of dysphoria with taking Wellbutrin and Strattera at same time Headache: No Seizure: No Paresthesias: No  Current Medications: Current Outpatient Medications  Medication Sig Dispense Refill  . apixaban (ELIQUIS) 5 MG TABS tablet Take 5 mg by mouth.    Marland Kitchen atomoxetine (STRATTERA) 60 MG capsule Take 1 capsule (60 mg total) by mouth daily. 30 capsule 2  . buPROPion (WELLBUTRIN SR) 150 MG 12 hr tablet Take 1 tablet (150 mg total) by mouth 2 (two) times daily. 90 tablet 2  . cyclobenzaprine (FLEXERIL) 10 MG tablet Take by mouth.    . EPINEPHrine 0.3 mg/0.3 mL IJ SOAJ injection Inject into the skin.    . hydrochlorothiazide (HYDRODIURIL) 25 MG tablet TAKE 1 TABLET BY MOUTH EVERY DAY    . lisinopril (PRINIVIL,ZESTRIL) 5 MG tablet Take by mouth.    . meclizine (ANTIVERT) 50 MG tablet Take by mouth.    . medroxyPROGESTERone (DEPO-PROVERA) 150 MG/ML injection  Inject 150 mg into the muscle. Every 3 months    . pregabalin (LYRICA) 150 MG capsule Take 1 capsule (150 mg total) by mouth 3 (three) times daily. 90 capsule 2  . traZODone (DESYREL) 50 MG tablet Take 1 tablet (50 mg total) by mouth at bedtime and may repeat dose one time if needed. 60 tablet 2  . valACYclovir (VALTREX) 500 MG tablet Take 1 tablet twice daily for 3 days with outbreaks     No current facility-administered medications for this visit.       Mental Status Examination  Appearance:Neat Well Groomed Alert: Yes Attention: good  Cooperative: Yes and No Eye Contact: Good Speech: Clear and coherent Psychomotor Activity: Normal Memory/Concentration: Normal/intact Oriented: person, place, time/date and situation Mood: Dysphoric Affect: Congruent Thought Processes and Associations: Coherent and Intact Fund of Knowledge: Good Thought Content: WDL NO SI/HI /obsessed with return to work       Insight: Limited/?lacking Judgement: Limited  UDS:THC  Diagnosis:  0 Cannabis abuse with physiological dependence  0 Alcohol abuse, continuous drinking behavior  0 Cocaine dependence in remission (H[facility])  0 Sedative, hypnotic or anxiolytic dependence, in remission (H[facility])  0 Chronic post-traumatic stress disorder (PTSD)  0 Bipolar 2 disorder ([facility])  0 Chronic anxiety  0 Attention deficit hyperactivity disorder (ADHD), predominantly inattentive type  0 Insomnia, unspecified type  0 Morbid obesity with BMI of 60.0-69.9, adult ([facility])  0 HSV (herpes simplex virus) anogenital infection  0 Family history of drug addiction  0 Family history of mental disorder  0 History of pulmonary embolism  Assessment: Avoiding feelings/root triggers with drug use/workaholism.  Treatment Plan: Informed she is currently scheduled to return to work after 6 weeks of treatment but that date can be moved up when she demonstrates her first priority is to her recovery and facing root causes that enable her ongoing  addictions.See Counselor's plan as well Maryjean Morn, PA-C

## 2018-03-15 NOTE — Addendum Note (Signed)
Addended by: Court Joy on: 03/15/2018 05:24 PM   Modules accepted: Orders, Level of Service

## 2018-03-16 ENCOUNTER — Encounter (HOSPITAL_COMMUNITY): Payer: Self-pay | Admitting: Psychology

## 2018-03-16 ENCOUNTER — Other Ambulatory Visit (HOSPITAL_COMMUNITY): Payer: 59 | Admitting: Psychology

## 2018-03-16 DIAGNOSIS — F4312 Post-traumatic stress disorder, chronic: Secondary | ICD-10-CM

## 2018-03-16 DIAGNOSIS — F1210 Cannabis abuse, uncomplicated: Secondary | ICD-10-CM | POA: Diagnosis not present

## 2018-03-16 DIAGNOSIS — F419 Anxiety disorder, unspecified: Secondary | ICD-10-CM

## 2018-03-16 DIAGNOSIS — F1421 Cocaine dependence, in remission: Secondary | ICD-10-CM

## 2018-03-16 DIAGNOSIS — F1220 Cannabis dependence, uncomplicated: Secondary | ICD-10-CM

## 2018-03-16 NOTE — Progress Notes (Signed)
    Daily Group Progress Note  Program: CD-IOP   03/16/2018 Alexis Mccullough [id number]  Diagnosis: Cannabis use disorder, severe, alcohol use disorder, severe, cocaine disorder, severe in full remission  Sobriety Date: 03/05/18  Group Time: 1-2:30pm  Participation Level: Active  Behavioral Response: Appropriate and sharing  Type of Therapy: Process Group  Interventions: Supportive  Topic: Process: The first half of group was spent in process. Members shared about any challenges, struggles or 'shining moments in early recovery'. Members shared about the things they had done since we last met, including any actions related or enhancing their sobriety. A new group member was present, and he introduced himself and shared about what had brought him here. Two random drug tests were collected. One group member was absent.   Group Time: 2:30-4pm  Participation Level: Active  Behavioral Response: Appropriate  Type of Therapy: Psycho-education  Interventions: Strength-based  Topic: Psychoeducation: Relapse Prevention. The second half of group was spent in a psycho-ed. The topic for today was a follow-up to yesterday's session on the causes of relapse. This session focused on relapse prevention. A slide show from YouTube was shown with a young female recovering addict identifying the five most common causes of relapse.it was a lively and energizing slide show and invited active feedback among group members. every member was able to identify something that they could relate to or had experienced in the past. There was some tension near the end of group when the counselor challenged two members to put away their phones. It generated a conversation about this behavior, that was ultimately identified as being rude to fellow group members. The session was intense, but members were awake and alive, and the session proved very effective.  Summary: The patient reported she had gone home and cooked  dinner for her mother. She ordered groceries that she will pick up tomorrow at [facility], which makes things a little easier for her. The patient noted that she is very relieved because her short-term disability has been approved. She was worried about her job and now she feels like she can relax, take the time she needs and really focus on recovery. "I feel more present". The patient shared she had gone on a date with an "older man' later in the evening. We met online". She reported he was 61 yo. He enjoys doing things and she admitted she had a lot of fun with him and intended to see him again. In the psycho-ed, the patient explained to the newest group member what the acronym, HALT, stands for. She agreed with another that she romanticizes the drug and I wonder 'if I am funnier high'? The patient reported the drugs definitely 'quiet the critic', that voice that is always criticizing her. As the session neared the end, the patient provided feedback about the other group members shutting down to the discussion. She admitted she does that too. After she is done talking about her stuff, "I shut down" and she admitted it is 'rude. I don't want to do that', she reported. This realization was very powerful for this group member and she admitted it ws a hugh 'a ha' moment for her. She made some insightful comments and responded well to this intervention.  UDS collected: No  AA/NA attended?:No  Sponsor?: Yes   Brandon Melnick, LCAS 03/16/2018 9:10 PM

## 2018-03-20 ENCOUNTER — Other Ambulatory Visit (HOSPITAL_COMMUNITY): Payer: 59

## 2018-03-20 NOTE — Progress Notes (Signed)
    Daily Group Progress Note  Program: CD-IOP   03/20/2018 Alexis Mccullough [id number]  Diagnosis:  Cannabis abuse with physiological dependence  Alcohol abuse, continuous drinking behavior  Cocaine dependence in remission ([place])  Sedative, hypnotic or anxiolytic dependence, in remission ([place])  Chronic post-traumatic stress disorder (PTSD)  Bipolar 2 disorder (H[facility])  Chronic anxiety  Attention deficit hyperactivity disorder (ADHD), predominantly inattentive type  Insomnia, unspecified type  Morbid obesity with BMI of 60.0-69.9, adult ([place])  HSV (herpes simplex virus) anogenital infection  Family history of drug addiction  Family history of mental disorder  History of pulmonary embolism   Sobriety Date: 03/05/2018  Group Time: 1-2:30pm  Participation Level: Active  Behavioral Response: Appropriate  Type of Therapy: Process Group  Interventions: Supportive  Topic:  Process: The first part of group began with process. Group members shared their experiences in early recovery since we last met on Monday. The Medical Director met with four group members to complete three medication checks and an initial treatment plan. On drug test was collected. One group member was absent due to her mother being hospitalized.      Group Time: 2:30-4pm  Participation Level: Active  Behavioral Response: Appropriate  Type of Therapy: Psycho-education Group  Interventions: CBT  Topic: Psycho-ed: Internal vs External Triggers; The second half of group was spent in psycho-ed discussing different types of triggers, both external and internal. The head CD -IOP counselor created a demonstration of the board of how triggers can lead to use. Group members filled out two different worksheets. The first worksheet helped group members identify places or situations that are external triggers for them. The second worksheet helped group members also identify emotional states that are  triggers for them.    Summary: The patient reported that she had attended four [place] meetings since we last met. The patient met with the Medical Director to complete a medication check. A drug test was collected from the patient. The patient shared that she was absent from group on Monday to attend a church funeral in [place]. 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  The patient shared that this November, she is learning to say "no." On Monday the patient reported that she attended an [place] meeting at the [facility] where she passed out biscuits. That patient shared that Tuesday was a rough day for her as she toured an assisted living facility for her mother. The patient reported feelings of "guilt" and "embarrassment." This day was reportedly so difficult for the patient that she attended three [place] meeting to make it through the day without using chemicals. The patient reported feeling isolated and processed what it has been like to care for her mother without the help of her sisters. During the psycho-ed portion of group, the patient identified several external triggers such as before a date and before sexual activities. The patient identified internal emotional state triggers such as feeling overwhelmed, pressured, and resentful.    UDS collected: Yes   AA/NA attended?: Yes Monday, Tuesday 3 times  Sponsor?: No   Brandon Melnick, LCAS 03/20/2018 9:15 PM

## 2018-03-22 ENCOUNTER — Other Ambulatory Visit (HOSPITAL_COMMUNITY): Payer: 59 | Admitting: Psychology

## 2018-03-22 ENCOUNTER — Encounter: Payer: Self-pay | Admitting: Medical

## 2018-03-22 DIAGNOSIS — F1220 Cannabis dependence, uncomplicated: Secondary | ICD-10-CM

## 2018-03-22 DIAGNOSIS — F4312 Post-traumatic stress disorder, chronic: Secondary | ICD-10-CM

## 2018-03-22 DIAGNOSIS — F1010 Alcohol abuse, uncomplicated: Secondary | ICD-10-CM

## 2018-03-22 DIAGNOSIS — F419 Anxiety disorder, unspecified: Secondary | ICD-10-CM

## 2018-03-22 DIAGNOSIS — Z813 Family history of other psychoactive substance abuse and dependence: Secondary | ICD-10-CM

## 2018-03-22 DIAGNOSIS — F1421 Cocaine dependence, in remission: Secondary | ICD-10-CM

## 2018-03-22 DIAGNOSIS — F1210 Cannabis abuse, uncomplicated: Secondary | ICD-10-CM

## 2018-03-22 DIAGNOSIS — Z6841 Body Mass Index (BMI) 40.0 and over, adult: Secondary | ICD-10-CM

## 2018-03-22 DIAGNOSIS — Z818 Family history of other mental and behavioral disorders: Secondary | ICD-10-CM

## 2018-03-22 DIAGNOSIS — F3181 Bipolar II disorder: Secondary | ICD-10-CM

## 2018-03-23 ENCOUNTER — Encounter (HOSPITAL_COMMUNITY): Payer: Self-pay | Admitting: Licensed Clinical Social Worker

## 2018-03-23 ENCOUNTER — Other Ambulatory Visit (HOSPITAL_COMMUNITY): Payer: 59 | Admitting: Psychology

## 2018-03-23 DIAGNOSIS — F1220 Cannabis dependence, uncomplicated: Secondary | ICD-10-CM

## 2018-03-23 DIAGNOSIS — F1210 Cannabis abuse, uncomplicated: Secondary | ICD-10-CM

## 2018-03-23 DIAGNOSIS — F4312 Post-traumatic stress disorder, chronic: Secondary | ICD-10-CM
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
## 2018-03-24 ENCOUNTER — Encounter (HOSPITAL_COMMUNITY): Payer: Self-pay | Admitting: Psychology
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
## 2018-03-27 ENCOUNTER — Other Ambulatory Visit (HOSPITAL_COMMUNITY): Payer: 59

## 2018-03-29 ENCOUNTER — Encounter (HOSPITAL_COMMUNITY): Payer: Self-pay

## 2018-03-29 ENCOUNTER — Other Ambulatory Visit (HOSPITAL_COMMUNITY): Payer: 59

## 2018-03-29 NOTE — Progress Notes (Signed)
    Daily Group Progress Note  Program: CD-IOP   03/29/2018 Alexis Mccullough [id number]  Diagnosis:  Cannabis abuse with physiological dependence  Cocaine dependence in remission [facility])  Chronic post-traumatic stress disorder (PTSD)  Chronic anxiety  Alcohol abuse, continuous drinking behavior  Bipolar 2 disorder ([place])  Morbid obesity with BMI of 60.0-69.9, adult ([place])  Family history of drug addiction  Family history of mental disorder   Sobriety Date: 03/20/2018  Group Time: 1-2:30pm  Participation Level: Active  Behavioral Response: Appropriate  Type of Therapy: Process Group  Interventions: Supportive  Topic: Process: The first part of group began with process. Group members shared their experiences in early recovery since we last met on Monday. One new group member was present and introduced himself to the group. One group member was absent. Three drug tests were collected.      Group Time: 2:30-4pm  Participation Level: Active  Behavioral Response: Appropriate  Type of Therapy: Psycho-education Group  Interventions: Meditation: Mindfulness  Topic: Psychoed: Practicing Mindfulness; The second half of group was spent in psycho-ed discussing less commonly known ways to practice mindfulness. Group members were each given a piece of Hershey's chocolate and instructed to slowly savor the aroma of the chocolate, let the chocolate melt in their mouths, and really focus on the experience of having a piece of chocolate. Group counselors and members processed how mindfulness can be incorporated into eating. Next, group members were lead in a guided imagery exercise to practice the mindfulness skill of creating a "happy" place. Afterwards, group members were lead in a mindful coloring exercise where they were instructed to practice thinking about the colors they used and the choices they had in creating a work of art.    Summary: The patient reported that she had  attended one [place] meeting since we last met on Monday. A drug test was collected from the patient. The patient opened her shared with "I relapsed like hell." The patient shared that Saturday morning she came home to find a pot of grits overflowing on the stop top with her mother laying down in the bed. The patient processed her anger and frustration of having "no help." The patient stated, "I am in a cage," in response to what her living situation with her mother has become. She is fearful to leave her mother alone for extended periods of time. On Monday, the patient was absent from group because it was a holiday and the adult daycare was unable to pick her mother up. The patient filed for emergency Medicare for her mother. The patient shared that due the stress of being home alone with her mom, she smoked all weekend. The patient processed the anger and resentment she has towards her mom. She shared with the group that she is putting more into her mother than her mother ever put into her. During the psycho-ed portion of group, the patient shared that she did not like waiting to eat the chocolate, she felt "inpatient." The patient also felt "annoyed" when she was asked to wait before coloring the sheet of paper. The patient processed needing to open up her mind to trying more things.    UDS collected: Yes   AA/NA attended?: Yes Tuesday  Sponsor?: Yes   Brandon Melnick, [place] 03/29/2018 8:33 PM

## 2018-03-30 ENCOUNTER — Telehealth (HOSPITAL_COMMUNITY): Payer: Self-pay | Admitting: Psychology

## 2018-03-30 ENCOUNTER — Other Ambulatory Visit (HOSPITAL_COMMUNITY): Payer: 59

## 2018-04-03 ENCOUNTER — Encounter: Payer: Self-pay | Admitting: Medical

## 2018-04-03 ENCOUNTER — Other Ambulatory Visit (INDEPENDENT_AMBULATORY_CARE_PROVIDER_SITE_OTHER): Payer: 59 | Admitting: Psychology

## 2018-04-03 ENCOUNTER — Encounter (HOSPITAL_COMMUNITY): Payer: Self-pay | Admitting: Medical

## 2018-04-03 ENCOUNTER — Encounter (HOSPITAL_COMMUNITY): Payer: Self-pay

## 2018-04-03 DIAGNOSIS — Z818 Family history of other mental and behavioral disorders: Secondary | ICD-10-CM

## 2018-04-03 DIAGNOSIS — Z6841 Body Mass Index (BMI) 40.0 and over, adult: Secondary | ICD-10-CM

## 2018-04-03 DIAGNOSIS — F900 Attention-deficit hyperactivity disorder, predominantly inattentive type: Secondary | ICD-10-CM

## 2018-04-03 DIAGNOSIS — F419 Anxiety disorder, unspecified: Secondary | ICD-10-CM

## 2018-04-03 DIAGNOSIS — F4312 Post-traumatic stress disorder, chronic: Secondary | ICD-10-CM

## 2018-04-03 DIAGNOSIS — Z813 Family history of other psychoactive substance abuse and dependence: Secondary | ICD-10-CM

## 2018-04-03 DIAGNOSIS — G4700 Insomnia, unspecified: Secondary | ICD-10-CM

## 2018-04-03 DIAGNOSIS — F1220 Cannabis dependence, uncomplicated: Secondary | ICD-10-CM

## 2018-04-03 DIAGNOSIS — F1010 Alcohol abuse, uncomplicated: Secondary | ICD-10-CM

## 2018-04-03 DIAGNOSIS — F1210 Cannabis abuse, uncomplicated: Secondary | ICD-10-CM

## 2018-04-03 DIAGNOSIS — F1321 Sedative, hypnotic or anxiolytic dependence, in remission: Secondary | ICD-10-CM

## 2018-04-03 DIAGNOSIS — Z86711 Personal history of pulmonary embolism: Secondary | ICD-10-CM

## 2018-04-03 DIAGNOSIS — A609 Anogenital herpesviral infection, unspecified: Secondary | ICD-10-CM

## 2018-04-03 DIAGNOSIS — F1421 Cocaine dependence, in remission: Secondary | ICD-10-CM

## 2018-04-03 DIAGNOSIS — F3181 Bipolar II disorder: Secondary | ICD-10-CM

## 2018-04-03 NOTE — Progress Notes (Signed)
[facility] Follow-up Outpatient Visit   Date: 04/03/2018 Admission Date: 03/01/2018  Sobriety date:LAST USE TODAY  Subjective: "I'm dizzy"  HPI Pt returns after missing last week of treatment taking care of getting mother placed out of her apartment and establishing care with new PCP. Unfortunately she returned to use of daily pot and a "couple of drinks at a bar" over the weekend. In group she c/o feeling dizzy sitting and having not taken her BP meds.She was taken to [facility] where BP was recorded as 85/69 off BP meds. She denied chest pain;HA.She refused to go to ED despite warning of possible serious underlying cause including silent MI.  She says she did get mother special status for Assisted living but homes that she feels are safe have waiting lists.She has arranged with her sister to have Mom stay with sister for 2 weeks while she continues to work phoned to place Mother.She feels she will not be absent during that period.  Review of Systems: Psychiatric: Agitation: At home Hallucination: No Depressed Mood: Yes Insomnia: Rx Trazodone Hypersomnia: No Altered Concentration: Hx of ADD Feels Worthless: Yes  Grandiose Ideas: No Belief In Special Powers: No New/ Substance Abuse: Yes Compulsions: Yes  Neurologic: Headache: No Seizure: No Paresthesias: No  Current Medications: (suggestion)  Always use your most recent med list.    Your Medication List  apixaban 5 MG Tabs tablet  Commonly known as: ELIQUIS  Take 5 mg by mouth.   atomoxetine 60 MG capsule  Commonly known as: STRATTERA  Take 1 capsule (60 mg total) by mouth daily.   buPROPion 150 MG 12 hr tablet  Commonly known as: WELLBUTRIN SR  Take 1 tablet (150 mg total) by mouth 2 (two) times daily.   cyclobenzaprine 10 MG tablet  Commonly known as: FLEXERIL  Take by mouth.   EPINEPHrine 0.3 mg/0.3 mL Soaj injection  Commonly known as: EPI-PEN  Inject into the skin.   hydrochlorothiazide 25 MG  tablet  Commonly known as: HYDRODIURIL  TAKE 1 TABLET BY MOUTH EVERY DAY   lisinopril 5 MG tablet  Commonly known as: PRINIVIL,ZESTRIL  Take by mouth.   meclizine 50 MG tablet  Commonly known as: ANTIVERT  Take by mouth.   medroxyPROGESTERone 150 MG/ML injection  Commonly known as: DEPO-PROVERA  Inject 150 mg into the muscle. Every 3 months   pregabalin 150 MG capsule  Commonly known as: LYRICA  Take 1 capsule (150 mg total) by mouth 3 (three) times daily.   traZODone 50 MG tablet  Commonly known as: DESYREL  Take 1 tablet (50 mg total) by mouth at bedtime and may repeat dose one time if needed.   valACYclovir 500 MG tablet  Commonly known as: VALTREX  Take 1 tablet twice daily for 3 days with outbreaks    Mental Status Examination  Appearance: Well groomed Unsteady on feet Alert: Yes Attention: good  Cooperative: Medically no/Noncompliance with CDIOP Eye Contact: Good Speech: Clear and coherent Psychomotor Activity: ATAXIA Memory/Concentration: Normal/intact Oriented: person, place, time/date and situation Mood: defiant but without aggression Affect:Congruent Thought Processes and Associations: Coherent and Intact Fund of Knowledge: Has knowledge but cannot or will not implement Thought Content: WDL/insists onputting other priorities above her treatment Insight: Sees but doesnt do Judgement: poor/impaired   Musculoskeletal: Strength & Muscle Tone: within normal limits Gait & Station: unsteady Patient leans: N/A   AVW:[id number]:Pending  Diagnosis:  0 Cannabis abuse with physiological dependence  0 Alcohol abuse, continuous drinking behavior  0  Cocaine dependence in remission (HCC)  0 Sedative, hypnotic or anxiolytic dependence, in remission (HCC)  0 Chronic post-traumatic stress disorder (PTSD)  0 Chronic anxiety  0 Bipolar 2 disorder (HCC)  0 Morbid obesity with BMI of 60.0-69.9, adult (HCC)  0 Family history of drug addiction  0 Family history of mental disorder   0 Attention deficit hyperactivity disorder (ADHD), predominantly inattentive type  0 Insomnia, unspecified type  0 HSV (herpes simplex virus) anogenital infection  0 History of pulmonary embolism   Assessment: Unable/unwilling to put taking care of herself first at this time -a pattern of a lifetime   Treatment Plan: Continue CD IOPPer admission note/Initial Evaluation Pt signed AMA regarding referral to ED Consider Behavioral Contract-will discuss atb team meeting this Wednesday   Maryjean Morn, PA-C

## 2018-04-04 NOTE — Progress Notes (Signed)
Pt arrived to CD-IOP group therapy 1 hour late. She had flat affect, and stared at the wall occasionally. She reported she "did not have much to say". Pt was encouraged to share about her recent activities, mood, and her return to using marijuana and alcohol in the past week. She met w/ Medical Director for around 30 mins. Pt reported feeling dizzy and was taken to nurse on call for vital signs. Her BP was recorded as low and was advised by medical director to seek immediate medical attention. Pt refused this recommendation and stated she believed she needed sleep. Pt was asked about her plans following group to ensure she will not be tempted to smoking pot. Pt plans to meet w/ sponsor following this group.

## 2018-04-05 ENCOUNTER — Other Ambulatory Visit (HOSPITAL_COMMUNITY): Payer: 59 | Admitting: Psychology

## 2018-04-05 DIAGNOSIS — F4312 Post-traumatic stress disorder, chronic: Secondary | ICD-10-CM

## 2018-04-05 DIAGNOSIS — Z6841 Body Mass Index (BMI) 40.0 and over, adult: Secondary | ICD-10-CM

## 2018-04-05 DIAGNOSIS — F3181 Bipolar II disorder: Secondary | ICD-10-CM

## 2018-04-05 DIAGNOSIS — F1421 Cocaine dependence, in remission: Secondary | ICD-10-CM

## 2018-04-05 DIAGNOSIS — F1220 Cannabis dependence, uncomplicated: Secondary | ICD-10-CM

## 2018-04-05 DIAGNOSIS — F1210 Cannabis abuse, uncomplicated: Secondary | ICD-10-CM | POA: Diagnosis not present

## 2018-04-05 DIAGNOSIS — F419 Anxiety disorder, unspecified: Secondary | ICD-10-CM

## 2018-04-05 DIAGNOSIS — Z813 Family history of other psychoactive substance abuse and dependence: Secondary | ICD-10-CM

## 2018-04-10 ENCOUNTER — Other Ambulatory Visit (HOSPITAL_COMMUNITY): Payer: 59 | Attending: Psychiatry | Admitting: Psychology

## 2018-04-10 ENCOUNTER — Encounter: Payer: Self-pay | Admitting: Medical

## 2018-04-10 DIAGNOSIS — Z7901 Long term (current) use of anticoagulants: Secondary | ICD-10-CM | POA: Insufficient documentation

## 2018-04-10 DIAGNOSIS — F1010 Alcohol abuse, uncomplicated: Secondary | ICD-10-CM | POA: Insufficient documentation

## 2018-04-10 DIAGNOSIS — F3181 Bipolar II disorder: Secondary | ICD-10-CM | POA: Insufficient documentation

## 2018-04-10 DIAGNOSIS — F900 Attention-deficit hyperactivity disorder, predominantly inattentive type: Secondary | ICD-10-CM | POA: Insufficient documentation

## 2018-04-10 DIAGNOSIS — G4700 Insomnia, unspecified: Secondary | ICD-10-CM | POA: Insufficient documentation

## 2018-04-10 DIAGNOSIS — F4312 Post-traumatic stress disorder, chronic: Secondary | ICD-10-CM

## 2018-04-10 DIAGNOSIS — Z813 Family history of other psychoactive substance abuse and dependence: Secondary | ICD-10-CM | POA: Insufficient documentation

## 2018-04-10 DIAGNOSIS — I10 Essential (primary) hypertension: Secondary | ICD-10-CM | POA: Insufficient documentation

## 2018-04-10 DIAGNOSIS — Z86711 Personal history of pulmonary embolism: Secondary | ICD-10-CM | POA: Insufficient documentation

## 2018-04-10 DIAGNOSIS — Z793 Long term (current) use of hormonal contraceptives: Secondary | ICD-10-CM | POA: Insufficient documentation

## 2018-04-10 DIAGNOSIS — F1220 Cannabis dependence, uncomplicated: Secondary | ICD-10-CM

## 2018-04-10 DIAGNOSIS — F1421 Cocaine dependence, in remission: Secondary | ICD-10-CM | POA: Insufficient documentation

## 2018-04-10 DIAGNOSIS — Z818 Family history of other mental and behavioral disorders: Secondary | ICD-10-CM | POA: Insufficient documentation

## 2018-04-10 DIAGNOSIS — F419 Anxiety disorder, unspecified: Secondary | ICD-10-CM

## 2018-04-10 DIAGNOSIS — Z79899 Other long term (current) drug therapy: Secondary | ICD-10-CM | POA: Insufficient documentation

## 2018-04-10 DIAGNOSIS — F1210 Cannabis abuse, uncomplicated: Secondary | ICD-10-CM | POA: Insufficient documentation

## 2018-04-10 DIAGNOSIS — F1320 Sedative, hypnotic or anxiolytic dependence, uncomplicated: Secondary | ICD-10-CM | POA: Insufficient documentation

## 2018-04-11 ENCOUNTER — Ambulatory Visit (INDEPENDENT_AMBULATORY_CARE_PROVIDER_SITE_OTHER): Payer: 59 | Admitting: Licensed Clinical Social Worker

## 2018-04-11 ENCOUNTER — Encounter (HOSPITAL_COMMUNITY): Payer: Self-pay | Admitting: Licensed Clinical Social Worker

## 2018-04-11 DIAGNOSIS — F1210 Cannabis abuse, uncomplicated: Secondary | ICD-10-CM

## 2018-04-11 DIAGNOSIS — F1010 Alcohol abuse, uncomplicated: Secondary | ICD-10-CM

## 2018-04-11 DIAGNOSIS — F1220 Cannabis dependence, uncomplicated: Secondary | ICD-10-CM

## 2018-04-11 DIAGNOSIS — F4312 Post-traumatic stress disorder, chronic: Secondary | ICD-10-CM

## 2018-04-11 NOTE — Progress Notes (Signed)
  CD-IOP INDIVIDUAL THERAPY  THERAPIST PROGRESS NOTE  Session Time: 9-10  Participation Level: Active  Behavioral Response: Neat and Well GroomedAlertEuthymic  Type of Therapy: Individual Therapy  Treatment Goals addressed: Anxiety  Interventions: CBT and Other: Brain Based mindfulness somatic processing  Summary: Alexis Mccullough is a 37 y.o. female who presents with hx of chemical dependency and PTSD.   Pt was alert, lucid, and made strong eye contact. Pt discussed her feelings of "imposter syndrome" while trying to join a community of powerful business women. Counselor and pt worked to challenge negative core belief that pt "does not belong there". Counselor described brainspotting to pt and introduced idea of scanning visual field for deeper processing. Pt discovered she felt activated on right side and it reminded her of waking up to her dead fiance on that side of the bed. Pt left session feeling lighter and "relieved".   Suicidal/Homicidal: Nowithout intent/plan  Therapist Response: Counselor used open questions, active listening, and encouragement. Counselor used reframing to help pt challenge her feelings of "imposter syndrome". Pt continues to feel inadequate and "not enough" as her core belief but shows signs of being able to better challenge it.  Plan: Continue in CD-IOP for 1 month  Diagnosis:    ICD-10-CM   1. Cannabis abuse with physiological dependence F12.10   2. Alcohol abuse, continuous drinking behavior F10.10   3. Chronic post-traumatic stress disorder (PTSD) F43.12        Margo CommonWesley E Swan, LCAS-A 04/11/2018

## 2018-04-12 ENCOUNTER — Other Ambulatory Visit (HOSPITAL_COMMUNITY): Payer: 59 | Admitting: Psychology

## 2018-04-12 DIAGNOSIS — F3181 Bipolar II disorder: Secondary | ICD-10-CM

## 2018-04-12 DIAGNOSIS — F1220 Cannabis dependence, uncomplicated: Secondary | ICD-10-CM

## 2018-04-12 DIAGNOSIS — F4312 Post-traumatic stress disorder, chronic: Secondary | ICD-10-CM

## 2018-04-12 DIAGNOSIS — A609 Anogenital herpesviral infection, unspecified: Secondary | ICD-10-CM

## 2018-04-12 DIAGNOSIS — F1210 Cannabis abuse, uncomplicated: Secondary | ICD-10-CM

## 2018-04-13 ENCOUNTER — Other Ambulatory Visit (HOSPITAL_COMMUNITY): Payer: 59 | Admitting: Psychology

## 2018-04-13 DIAGNOSIS — F3181 Bipolar II disorder: Secondary | ICD-10-CM

## 2018-04-13 DIAGNOSIS — F4312 Post-traumatic stress disorder, chronic: Secondary | ICD-10-CM

## 2018-04-13 DIAGNOSIS — F1220 Cannabis dependence, uncomplicated: Secondary | ICD-10-CM

## 2018-04-13 DIAGNOSIS — F1010 Alcohol abuse, uncomplicated: Secondary | ICD-10-CM

## 2018-04-13 DIAGNOSIS — F419 Anxiety disorder, unspecified: Secondary | ICD-10-CM

## 2018-04-13 DIAGNOSIS — Z6841 Body Mass Index (BMI) 40.0 and over, adult: Secondary | ICD-10-CM

## 2018-04-13 DIAGNOSIS — F1210 Cannabis abuse, uncomplicated: Secondary | ICD-10-CM

## 2018-04-13 DIAGNOSIS — A609 Anogenital herpesviral infection, unspecified: Secondary | ICD-10-CM

## 2018-04-14 ENCOUNTER — Encounter (HOSPITAL_COMMUNITY): Payer: Self-pay | Admitting: Psychology

## 2018-04-14 NOTE — Progress Notes (Signed)
    Daily Group Progress Note  Program: CD-IOP   04/14/2018 Alexis Mccullough [id number]  Diagnosis: Alcohol Use Disorder, severe, Cannabis use disorder, severe, PTSD  Sobriety Date: 04/04/18  Group Time: 1-2:30pm  Participation Level: Active  Behavioral Response: Sharing  Type of Therapy: Process Group  Interventions: Supportive  Topic: Process: The first part of group began with process. Group members shared their experiences in early recovery since we last met on Monday. One group member was absent because he was traveling to visit family. No drug tests were collected. The Medical Director met with three group members to complete two medication checks and one discharge plan.   Group Time: 2:30-4pm  Participation Level: Active  Behavioral Response: Appropriate  Type of Therapy: Psycho-education Group  Interventions: Strength-based  Topic: Psycho-ed: 12 Tips to Stay Sober During the Holidays and Graduation; The second half of group was spent in psycho-ed discussing 12 Tips to Stay [place], a handout provided by Alcoholics Anonymous. The handout was passed out to group members who took turns reading the 12 tips. The group spent time discussing how they could implement the tips into the upcoming holiday weekend. A graduation was held for one of the group members. Group members passed around the graduation medallion, rubbed in things they want the graduate to carry with him when he leaves the group, and rubbed out things they want the graduate to leave behind. The graduate's younger brother and a previous CD-IOP graduate attended the graduation.   Summary: The patient reported that she attended no NA meetings since we last met. The patient that today she is not feeling well. She stated is feeling "nauseous, with ringing ears, and a migraine." The patient shared that Monday after group she met with her sponsor at a coffee shop in [facility]. She also  reported to the group that she picked up an emergency supply of medication to get her through the week until Thanksgiving. The patient shared that Tuesday she visited her mom while she was at her sister's house. The patient attributed visiting her mom to codependency and "wanted to see her new set up." The patient shared that she is looking forward to going to [place] over the [place] weekend. She is attending a woman's conference and will be around other powerful and creative women. The patient shared about why she missed her counseling appointment on Tuesday with another CD-IOP counselor. She shared that she took a ride that she thought was going to be a local ride but ended up having to go to the [place] airport. This caused her to completely miss her appointment. During the psycho-ed portion of group, the patient shared that she plans to use the AA tip of going to church. She will be spending most of her holiday weekend at the recovery church in [place].    UDS collected: No   AA/NA attended?: No  Sponsor?: Yes   Brandon Melnick, LCAS 04/14/2018 12:23 PM

## 2018-04-16 ENCOUNTER — Encounter (HOSPITAL_COMMUNITY): Payer: Self-pay | Admitting: Psychology

## 2018-04-16 NOTE — Progress Notes (Signed)
    Daily Group Progress Note  Program: CD-IOP   04/16/2018 Irisha N Strey [id number]  Diagnosis: Cannabis Use Disorder, severe, Alcohol use disorder, severe, PTSD  Sobriety Date: 11/26  Group Time: 1-2:30pm  Participation Level: Active  Behavioral Response: Sharing  Type of Therapy: Process Group  Interventions: Supportive  Topic: Process: The first half of group was spent in process. Members shared about any challenges or successes they had achieved in early recovery.  A new group member appeared for thee first session and he was asked to introduce himself and share about what had brought him to the program. Two drug tests were collected.  Group Time: 2:30-4pm  Participation Level: Active  Behavioral Response: Appropriate  Type of Therapy: Psycho-ed  Interventions: Strength based  Topic: Psycho-Ed. The second half of group was a psycho-ed on the developmental aspects of recovery and the four major stages or phases that one typically goes through. A handout and check list were provided, and members were given a few minutes to identify where they see themselves in this developmental process. Group members differed on where they were on the list and in the stages of recovery. Not surprisingly, however, most attributed themselves with being much farther along than they might be, considering they are very new or 'raw' in sobriety.    Summary: The patient reported she had found two blunts while cleaning her room. She admitted she had no idea they were there and if she had, they would have been smoked a long time ago. The patient reported she had spent time with her friend, who is also her 'dealer' and she admitted she had bailed on being honest with her. She told her that her doctor had told her to stop smoking. The patient explained she had done that because she has stopped in the past and relapsed and her dealer has always given her a hard time when she goes back into sobriety.  She complained about mood swings and noted, "I take care of other people". She is recognizing her codependency, which the counselor noted was a helpful realization. In the psycho-ed, the patient identified on the handout that she doesn't trust the 'non-addicted world'. She has also experienced the collapse of support from her family which has proven very disheartening. On a positive or hopeful side, the patient identified that she is willing to disclose her recovering status to people and is very conscious of being a recovering addict. She made some insightful comments and offered helpful feedback to fellow group members.    UDS collected: No  AA/NA attended?: Yes, Wednesday evening  Sponsor?: Yes   Charmian Muffnn Monie Shere, LCAS 04/16/2018 6:27 PM

## 2018-04-17 ENCOUNTER — Encounter: Payer: Self-pay | Admitting: Medical

## 2018-04-17 ENCOUNTER — Other Ambulatory Visit (HOSPITAL_BASED_OUTPATIENT_CLINIC_OR_DEPARTMENT_OTHER): Payer: 59 | Admitting: Psychology

## 2018-04-17 DIAGNOSIS — F4312 Post-traumatic stress disorder, chronic: Secondary | ICD-10-CM

## 2018-04-17 DIAGNOSIS — F1010 Alcohol abuse, uncomplicated: Secondary | ICD-10-CM

## 2018-04-17 DIAGNOSIS — F1220 Cannabis dependence, uncomplicated: Secondary | ICD-10-CM

## 2018-04-17 DIAGNOSIS — F1210 Cannabis abuse, uncomplicated: Secondary | ICD-10-CM

## 2018-04-18 ENCOUNTER — Encounter (HOSPITAL_COMMUNITY): Payer: Self-pay

## 2018-04-18 NOTE — Progress Notes (Signed)
    Daily Group Progress Note  Program: CD-IOP   04/18/2018 Alexis Mccullough [id number]  Diagnosis:  Cannabis abuse with physiological dependence  Chronic post-traumatic stress disorder (PTSD)  Chronic anxiety   Sobriety Date: 11/25  Group Time: 1-2:30  Participation Level: Active  Behavioral Response: Appropriate and Sharing  Type of Therapy: Process Group  Interventions: CBT  Topic: Pts were active and engaged in group processing session. Counselor facilitated CBT-based processing questions to help pts discuss their recovery from mind-altering drugs/alcohol. Emphasis was placed on pts disclosing their challenges and successes pertaining to their treatment plan goals. One pt had a friend die over weekend of overdose and this was processed. UDS results were returned to some members and new samples were collected from some members. Two members met w/ Medical Director for f/u and discussion of MAT.       Group Time: 2:30-4  Participation Level: Active  Behavioral Response: Appropriate and Sharing  Type of Therapy: Psycho-education Group  Interventions: CBT  Topic: Patient were active and engaged in psychoeducation session on topic of "13 Irrational Beliefs" by Whitney Post. Pts shared about their various forms of black-and-white thinking and learned strategies for challenging cognitive rigidity.     Summary: Pt was active, engaged, and presented w/ good eye contact, and appropriate affect and euthymic mood. She reported having a "great sober weekend". She attended a [employer] for entrepreneurs in [place] and felt energized by this. She felt "imposter syndrome" upon entering conference but states she quickly felt at home w/ the women. Pt continues to gain insight into her feelings of discomfort when she notices uncomfortable emotions in others. She vacillates between planning and action stage of change. Continues to report difficulty tolerating boredom,  and prioritizing her sobriety-based self care.    UDS collected: No Results: negative  AA/NA attended?: YesSunday  Sponsor?: Yes   Wes Swan, LPCA LCASA 04/18/2018 9:20 AM

## 2018-04-19 ENCOUNTER — Other Ambulatory Visit (HOSPITAL_COMMUNITY): Payer: 59 | Admitting: Psychology

## 2018-04-19 DIAGNOSIS — F3181 Bipolar II disorder: Secondary | ICD-10-CM

## 2018-04-19 DIAGNOSIS — F1220 Cannabis dependence, uncomplicated: Secondary | ICD-10-CM

## 2018-04-19 DIAGNOSIS — F1210 Cannabis abuse, uncomplicated: Secondary | ICD-10-CM

## 2018-04-19 DIAGNOSIS — F4312 Post-traumatic stress disorder, chronic: Secondary | ICD-10-CM

## 2018-04-20 ENCOUNTER — Encounter (HOSPITAL_COMMUNITY): Payer: Self-pay | Admitting: Psychology

## 2018-04-20 ENCOUNTER — Other Ambulatory Visit (HOSPITAL_COMMUNITY): Payer: 59 | Admitting: Psychology

## 2018-04-20 DIAGNOSIS — F3181 Bipolar II disorder: Secondary | ICD-10-CM

## 2018-04-20 DIAGNOSIS — I10 Essential (primary) hypertension: Secondary | ICD-10-CM | POA: Diagnosis not present

## 2018-04-20 DIAGNOSIS — F1010 Alcohol abuse, uncomplicated: Secondary | ICD-10-CM | POA: Diagnosis not present

## 2018-04-20 DIAGNOSIS — Z86711 Personal history of pulmonary embolism: Secondary | ICD-10-CM | POA: Diagnosis not present

## 2018-04-20 DIAGNOSIS — F1210 Cannabis abuse, uncomplicated: Secondary | ICD-10-CM | POA: Diagnosis present

## 2018-04-20 DIAGNOSIS — F1220 Cannabis dependence, uncomplicated: Secondary | ICD-10-CM

## 2018-04-20 DIAGNOSIS — F900 Attention-deficit hyperactivity disorder, predominantly inattentive type: Secondary | ICD-10-CM | POA: Diagnosis not present

## 2018-04-20 DIAGNOSIS — Z79899 Other long term (current) drug therapy: Secondary | ICD-10-CM | POA: Diagnosis not present

## 2018-04-20 DIAGNOSIS — F4312 Post-traumatic stress disorder, chronic: Secondary | ICD-10-CM | POA: Diagnosis not present

## 2018-04-20 DIAGNOSIS — Z813 Family history of other psychoactive substance abuse and dependence: Secondary | ICD-10-CM | POA: Diagnosis not present

## 2018-04-20 DIAGNOSIS — F1320 Sedative, hypnotic or anxiolytic dependence, uncomplicated: Secondary | ICD-10-CM | POA: Diagnosis not present

## 2018-04-20 DIAGNOSIS — G4700 Insomnia, unspecified: Secondary | ICD-10-CM | POA: Diagnosis not present

## 2018-04-20 DIAGNOSIS — Z7901 Long term (current) use of anticoagulants: Secondary | ICD-10-CM | POA: Diagnosis not present

## 2018-04-20 DIAGNOSIS — Z818 Family history of other mental and behavioral disorders: Secondary | ICD-10-CM | POA: Diagnosis not present

## 2018-04-20 DIAGNOSIS — Z793 Long term (current) use of hormonal contraceptives: Secondary | ICD-10-CM | POA: Diagnosis not present

## 2018-04-20 DIAGNOSIS — F1421 Cocaine dependence, in remission: Secondary | ICD-10-CM | POA: Diagnosis not present
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
## 2018-04-24 ENCOUNTER — Other Ambulatory Visit (HOSPITAL_BASED_OUTPATIENT_CLINIC_OR_DEPARTMENT_OTHER): Payer: 59 | Admitting: Psychology

## 2018-04-24 ENCOUNTER — Telehealth (HOSPITAL_COMMUNITY): Payer: Self-pay | Admitting: Psychology

## 2018-04-24 ENCOUNTER — Encounter: Payer: Self-pay | Admitting: Medical

## 2018-04-24 DIAGNOSIS — F1220 Cannabis dependence, uncomplicated: Secondary | ICD-10-CM

## 2018-04-24 DIAGNOSIS — F3181 Bipolar II disorder: Secondary | ICD-10-CM

## 2018-04-24 DIAGNOSIS — F1210 Cannabis abuse, uncomplicated: Secondary | ICD-10-CM

## 2018-04-24 DIAGNOSIS — F4312 Post-traumatic stress disorder, chronic: Secondary | ICD-10-CM

## 2018-04-26 ENCOUNTER — Other Ambulatory Visit (HOSPITAL_COMMUNITY): Payer: Self-pay | Admitting: Medical

## 2018-04-26 ENCOUNTER — Other Ambulatory Visit (INDEPENDENT_AMBULATORY_CARE_PROVIDER_SITE_OTHER): Payer: 59 | Admitting: Psychology

## 2018-04-26 ENCOUNTER — Encounter (HOSPITAL_COMMUNITY): Payer: Self-pay | Admitting: Medical

## 2018-04-26 ENCOUNTER — Encounter (HOSPITAL_COMMUNITY): Payer: Self-pay | Admitting: Psychology

## 2018-04-26 DIAGNOSIS — F900 Attention-deficit hyperactivity disorder, predominantly inattentive type: Secondary | ICD-10-CM

## 2018-04-26 DIAGNOSIS — F1010 Alcohol abuse, uncomplicated: Secondary | ICD-10-CM

## 2018-04-26 DIAGNOSIS — F419 Anxiety disorder, unspecified: Secondary | ICD-10-CM

## 2018-04-26 DIAGNOSIS — F1220 Cannabis dependence, uncomplicated: Secondary | ICD-10-CM

## 2018-04-26 DIAGNOSIS — Z818 Family history of other mental and behavioral disorders: Secondary | ICD-10-CM

## 2018-04-26 DIAGNOSIS — F4312 Post-traumatic stress disorder, chronic: Secondary | ICD-10-CM

## 2018-04-26 DIAGNOSIS — F1321 Sedative, hypnotic or anxiolytic dependence, in remission: Secondary | ICD-10-CM

## 2018-04-26 DIAGNOSIS — Z86711 Personal history of pulmonary embolism: Secondary | ICD-10-CM

## 2018-04-26 DIAGNOSIS — Z813 Family history of other psychoactive substance abuse and dependence: Secondary | ICD-10-CM

## 2018-04-26 DIAGNOSIS — F1210 Cannabis abuse, uncomplicated: Secondary | ICD-10-CM

## 2018-04-26 DIAGNOSIS — F3181 Bipolar II disorder: Secondary | ICD-10-CM

## 2018-04-26 DIAGNOSIS — G4700 Insomnia, unspecified: Secondary | ICD-10-CM

## 2018-04-26 DIAGNOSIS — F1421 Cocaine dependence, in remission: Secondary | ICD-10-CM | POA: Diagnosis not present

## 2018-04-26 MED ORDER — BUPROPION HCL ER (SR) 150 MG PO TB12: 150.0000 mg | ORAL_TABLET | Freq: Two times a day (BID) | ORAL | 2 refills | Status: DC

## 2018-04-26 MED ORDER — HYDROXYZINE PAMOATE 25 MG PO CAPS: 25.0000 mg | ORAL_CAPSULE | ORAL | 2 refills | Status: DC | PRN
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
## 2018-04-26 NOTE — Progress Notes (Signed)
    Daily Group Progress Note  Program: CD-IOP   04/26/2018 Alexis Mccullough [id number]  Diagnosis:  Cannabis abuse with physiological dependence  Alcohol abuse, continuous drinking behavior  Chronic post-traumatic stress disorder (PTSD)   Sobriety Date: 11/26  Group Time: 1-2:30  Participation Level: Active  Behavioral Response: Appropriate and Sharing  Type of Therapy: Process Group  Interventions: CBT  Topic: Pts were active and engaged in group processing session. Counselor facilitated CBT-based processing questions to help pts discuss their recovery from mind-altering drugs/alcohol. Emphasis was placed on pts disclosing their challenges and successes pertaining to their treatment plan goals. UDS results were returned to some members and new samples were collected from some members. Two members met w/ Medical Director for f/u and discussion of MAT. One member returned after being [place] for one week.       Group Time: 2:30-4  Participation Level: Active  Behavioral Response: Appropriate and Sharing  Type of Therapy: Psycho-education Group  Interventions: Meditation: Chair Yoga  Topic: Patient were active and engaged in psychoeducation session led by Legrand Pitts, LPC. Clinician led a 1 hour mindfulness and chair yoga session. Pts participated actively and adapted each pose to fit their unique abilities. Pts were asked to share about their experience of chair yoga after completing session.     Summary: Pt was open, sharing, and somewhat despondent during group. She reported she attended AA 2 times. Pt expressed regret for lying to her dealer/friend about being in recovery "for health reasons" rather than being addicted to marijuana. Pt was active and engaged in yoga session.   UDS collected: Yes Results: positive for marijuana  AA/NA attended?: YesFriday and Saturday  Sponsor?: Yes   Youlanda Roys, LPCA LCASA 04/26/2018 1:46 PM
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
## 2018-04-27 ENCOUNTER — Other Ambulatory Visit (HOSPITAL_COMMUNITY): Payer: 59 | Admitting: Psychology

## 2018-04-27 ENCOUNTER — Encounter (HOSPITAL_COMMUNITY): Payer: Self-pay | Admitting: Psychology

## 2018-04-27 DIAGNOSIS — F3181 Bipolar II disorder: Secondary | ICD-10-CM

## 2018-04-27 DIAGNOSIS — F4312 Post-traumatic stress disorder, chronic: Secondary | ICD-10-CM

## 2018-04-27 DIAGNOSIS — F1210 Cannabis abuse, uncomplicated: Secondary | ICD-10-CM

## 2018-04-27 DIAGNOSIS — F1220 Cannabis dependence, uncomplicated: Secondary | ICD-10-CM
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
## 2018-05-01 ENCOUNTER — Other Ambulatory Visit (HOSPITAL_COMMUNITY): Payer: 59 | Admitting: Medical

## 2018-05-01 ENCOUNTER — Encounter (HOSPITAL_COMMUNITY): Payer: Self-pay | Admitting: Psychology

## 2018-05-02 ENCOUNTER — Encounter (HOSPITAL_COMMUNITY): Payer: Self-pay | Admitting: Psychology

## 2018-05-02 NOTE — Progress Notes (Signed)
    Daily Group Progress Note  Program: CD-IOP   05/02/2018 Dorraine N Pandya [id number]  Diagnosis: Cannabis Use Disorder, Severe, Alcohol Use Disorder, severe, Chronic Post Traumatic Stress disorder  Sobriety Date: 04/04/18  Group Time: 1-2:30pm  Participation Level: Active  Behavioral Response: Sharing  Type of Therapy: Process Group  Interventions: Supportive  Topic: Process: The first part of group began with process. Group members shared their experiences in early recovery since we last met on Wednesday. Two group members were absent. One group member was present for 15 minutes but left after her sharing her check in. Two drug tests collected.   Group Time: 2:30-4pm  Participation Level: Active  Behavioral Response: Sharing  Type of Therapy: Psycho-education Group  Interventions: CBT  Topic: Psycho-ed: Strengthening Your Self-Control Muscle; The second half of group was spent in psycho-ed discussing ways to strengthen group's members abilities to practice self-control. Before handing out a self-control assessment, group members were lead in a three-minute mindfulness mediation to practice being present in the moment. After the mediation, group members completed a 10 item self-assessment measure to calculate their self-control score. Group members processed their individual scores with the group and discussed situations that weaken their ability to practice self-control. A second handout was passed out and group members created a plan of ways to practice self-care so that their self-control muscles can strengthen over time.   Summary: The patient reported she had not attended any AA meetings since we last met. She stated that on Wednesday after leaving group she had the "worst night." The patient reported that her mother is "sun downing" and was "cutting a monkey" when the patient returned home. The patient shared that she did not allow her mothers actions  to bother her.  Instead, she turned off all the lights, diffused some lavender in her mother's room, and went to her own bedroom. The patient shared that she locked her bedroom door, pulled a chair to block it and practiced some deep breathing. The patient was really proud of herself and attributes her ability to stay calm to the skills she has learned in group. She has also learned more about dementia and has read about how to handle "sun downing." The patient shared, "I would have loved to drink, but I did not." This morning, the patient reportedly called a lady from [employer] and had lunch with her before group. The patient shared that she had reservations about meeting this woman for lunch because she's a [place]. The patient stated, "she is a witch." However, the patient overcame her biases and went to lunch anyways. She shared with the group that she really enjoyed lunch and plans to have lunch with this lady again. During the psycho-ed portion of group, the patient shared that being in situations where she experiences real or perceived rejection make it hard for her to practice self-control. The patient plans to counter act these effects on her self-control muscle by taking three pauses to slow down and think about what has actually been said to her. While she was upset upon arriving to the session, the patient calmed down and was able to engage appropriately in the psycho-ed.    UDS collected: No   AA/NA attended?: No  Sponsor?: Yes   Brandon Melnick, LCAS 05/02/2018 9:11 AM
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
## 2018-05-04 ENCOUNTER — Other Ambulatory Visit (HOSPITAL_COMMUNITY): Payer: 59

## 2018-05-07 ENCOUNTER — Encounter (HOSPITAL_COMMUNITY): Payer: Self-pay | Admitting: Psychology

## 2018-05-07 NOTE — Progress Notes (Signed)
    Daily Group Progress Note  Program: CD-IOP   05/07/2018 Alexis Mccullough [id number]  Diagnosis: Cannabis use disorder, severe, alcohol use disorder, severe, Chronic PTSD  Sobriety Date: 04/04/18  Group Time: 1-2:30pm  Participation Level: Active  Behavioral Response: Sharing  Type of Therapy: Process Group  Interventions: Supportive  Topic: Process; The first half of group was spent in process. Members shared about the struggles and challenges they are experiencing in early recovery. Also included in these disclosures were details about what each member has done to enhance and strengthen their sobriety since we last met. The medical director met with one group member for a med check during this half of group.  Group Time: 2:30-4pm  Participation Level: Active  Behavioral Response: Sharing  Type of Therapy: Psycho-education Group  Interventions: CBT  Topic: Psychoeducation: Step 1 - Behavior; make the commitment not to use. The second half of group was a psycho-ed on making a commitment that 'whatever you do, you don't use'. The counselor provided a presentation on Step 1 and addressing the biological aspect of addiction. Despite not really understanding the way it works, Step 1 and remaining abstinent will be followed by Step 2 and the psychological aspects of the disease. Recognizing one cannot do it on their own and there is another way. Group members were actively engaged in the session and offered feedback and their own experiences in early recovery.    Summary: The patient reported she has been struggling with her emotions and feelings. "I feel alone and have had really bad mood swings", she explained. She shared about the ongoing struggles with her mother and efforts to secure some sort of assisted living for her deteriorating demented parent. When asked about her siblings, the patient was tearful as she recounted their refusal to engage in caring for their mother and  the obvious pain, she feels because of this. It was noted that she has, at times, resisted her siblings' help and perhaps she has taught them to back off and let her take care of everything. This seems to be the message she has sent to other family members for many years. The patient met briefly for a med check with the medical director during the beginning of the psycho-ed. Upon return, she was engaged and agreed she was powerless, but admitted there were still reservations about her use of cannabis. She noted the cultural and social acceptance of weed and her memory lapses of what has generally always occurred after she returned to use from a period of abstinence. She also experiences powerful emotions and finds it difficult, at times, to regulate her emotions and use the many tools she has at her disposal. We will follow closely in the days ahead.   UDS collected: No  AA/NA attended?: Yes, Tuesday  Sponsor?: Yes   Brandon Melnick, LCAS 05/07/2018 2:16 PM

## 2018-05-08 ENCOUNTER — Other Ambulatory Visit (HOSPITAL_COMMUNITY): Payer: 59

## 2018-05-09 ENCOUNTER — Telehealth (HOSPITAL_COMMUNITY): Payer: Self-pay | Admitting: Psychiatry

## 2018-05-11 ENCOUNTER — Other Ambulatory Visit (HOSPITAL_COMMUNITY): Payer: 59

## 2018-05-15 ENCOUNTER — Telehealth (HOSPITAL_COMMUNITY): Payer: Self-pay | Admitting: Psychiatry

## 2018-05-15 ENCOUNTER — Other Ambulatory Visit (HOSPITAL_COMMUNITY): Payer: 59

## 2018-05-17 ENCOUNTER — Other Ambulatory Visit (HOSPITAL_COMMUNITY): Payer: 59

## 2018-05-18 ENCOUNTER — Other Ambulatory Visit (HOSPITAL_COMMUNITY): Payer: 59

## 2018-05-22 ENCOUNTER — Other Ambulatory Visit (HOSPITAL_COMMUNITY): Payer: 59

## 2018-05-24 ENCOUNTER — Other Ambulatory Visit (HOSPITAL_COMMUNITY): Payer: 59

## 2018-05-24 ENCOUNTER — Telehealth (HOSPITAL_COMMUNITY): Payer: Self-pay | Admitting: Psychiatry

## 2018-05-25 ENCOUNTER — Other Ambulatory Visit (HOSPITAL_COMMUNITY): Payer: 59

## 2018-05-29 ENCOUNTER — Other Ambulatory Visit (HOSPITAL_COMMUNITY): Payer: 59

## 2018-05-30 ENCOUNTER — Other Ambulatory Visit (HOSPITAL_COMMUNITY): Payer: 59 | Attending: Family | Admitting: Psychiatry

## 2018-05-30 ENCOUNTER — Encounter (HOSPITAL_COMMUNITY): Payer: Self-pay | Admitting: Psychiatry

## 2018-05-30 DIAGNOSIS — Z793 Long term (current) use of hormonal contraceptives: Secondary | ICD-10-CM | POA: Insufficient documentation

## 2018-05-30 DIAGNOSIS — N189 Chronic kidney disease, unspecified: Secondary | ICD-10-CM | POA: Insufficient documentation

## 2018-05-30 DIAGNOSIS — I129 Hypertensive chronic kidney disease with stage 1 through stage 4 chronic kidney disease, or unspecified chronic kidney disease: Secondary | ICD-10-CM | POA: Insufficient documentation

## 2018-05-30 DIAGNOSIS — F419 Anxiety disorder, unspecified: Secondary | ICD-10-CM | POA: Insufficient documentation

## 2018-05-30 DIAGNOSIS — Z7901 Long term (current) use of anticoagulants: Secondary | ICD-10-CM | POA: Insufficient documentation

## 2018-05-30 DIAGNOSIS — G4700 Insomnia, unspecified: Secondary | ICD-10-CM | POA: Insufficient documentation

## 2018-05-30 DIAGNOSIS — F3181 Bipolar II disorder: Secondary | ICD-10-CM | POA: Insufficient documentation

## 2018-05-30 DIAGNOSIS — Z79899 Other long term (current) drug therapy: Secondary | ICD-10-CM | POA: Insufficient documentation

## 2018-05-30 DIAGNOSIS — F909 Attention-deficit hyperactivity disorder, unspecified type: Secondary | ICD-10-CM | POA: Insufficient documentation

## 2018-05-30 DIAGNOSIS — Z8249 Family history of ischemic heart disease and other diseases of the circulatory system: Secondary | ICD-10-CM | POA: Insufficient documentation

## 2018-05-30 DIAGNOSIS — Z880 Allergy status to penicillin: Secondary | ICD-10-CM | POA: Insufficient documentation

## 2018-05-30 DIAGNOSIS — F17210 Nicotine dependence, cigarettes, uncomplicated: Secondary | ICD-10-CM | POA: Insufficient documentation

## 2018-05-30 DIAGNOSIS — E1122 Type 2 diabetes mellitus with diabetic chronic kidney disease: Secondary | ICD-10-CM | POA: Insufficient documentation
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
## 2018-05-31 ENCOUNTER — Other Ambulatory Visit (HOSPITAL_COMMUNITY): Payer: 59

## 2018-05-31 ENCOUNTER — Telehealth (HOSPITAL_COMMUNITY): Payer: Self-pay | Admitting: Psychiatry

## 2018-05-31 ENCOUNTER — Other Ambulatory Visit (HOSPITAL_COMMUNITY): Payer: 59 | Admitting: Licensed Clinical Social Worker

## 2018-05-31 DIAGNOSIS — G4700 Insomnia, unspecified: Secondary | ICD-10-CM | POA: Diagnosis not present

## 2018-05-31 DIAGNOSIS — I129 Hypertensive chronic kidney disease with stage 1 through stage 4 chronic kidney disease, or unspecified chronic kidney disease: Secondary | ICD-10-CM | POA: Diagnosis not present

## 2018-05-31 DIAGNOSIS — F3181 Bipolar II disorder: Secondary | ICD-10-CM

## 2018-05-31 DIAGNOSIS — E1122 Type 2 diabetes mellitus with diabetic chronic kidney disease: Secondary | ICD-10-CM | POA: Diagnosis not present

## 2018-05-31 DIAGNOSIS — Z7901 Long term (current) use of anticoagulants: Secondary | ICD-10-CM | POA: Diagnosis not present

## 2018-05-31 DIAGNOSIS — F909 Attention-deficit hyperactivity disorder, unspecified type: Secondary | ICD-10-CM | POA: Diagnosis not present

## 2018-05-31 DIAGNOSIS — Z793 Long term (current) use of hormonal contraceptives: Secondary | ICD-10-CM | POA: Diagnosis not present

## 2018-05-31 DIAGNOSIS — Z8249 Family history of ischemic heart disease and other diseases of the circulatory system: Secondary | ICD-10-CM | POA: Diagnosis not present

## 2018-05-31 DIAGNOSIS — F419 Anxiety disorder, unspecified: Secondary | ICD-10-CM | POA: Diagnosis not present

## 2018-05-31 DIAGNOSIS — N189 Chronic kidney disease, unspecified: Secondary | ICD-10-CM | POA: Diagnosis not present

## 2018-05-31 DIAGNOSIS — Z880 Allergy status to penicillin: Secondary | ICD-10-CM | POA: Diagnosis not present

## 2018-05-31 DIAGNOSIS — Z79899 Other long term (current) drug therapy: Secondary | ICD-10-CM | POA: Diagnosis not present

## 2018-05-31 DIAGNOSIS — F17210 Nicotine dependence, cigarettes, uncomplicated: Secondary | ICD-10-CM | POA: Diagnosis not present

## 2018-05-31 MED ORDER — TRAZODONE HCL 50 MG PO TABS: 50.0000 mg | ORAL_TABLET | Freq: Every day | ORAL | 0 refills | Status: DC

## 2018-05-31 NOTE — Telephone Encounter (Signed)
D:  Placed call to [employer] to request an extension, since pt was only approved thru 05-27-18.  Spoke to Russell who took all the required information.  A:  Projected RTW on 06-26-18; without any restrictions.
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
## 2018-06-01 ENCOUNTER — Other Ambulatory Visit (HOSPITAL_COMMUNITY): Payer: 59 | Admitting: Psychiatry

## 2018-06-01 ENCOUNTER — Telehealth (HOSPITAL_COMMUNITY): Payer: Self-pay | Admitting: Psychiatry

## 2018-06-01 NOTE — Progress Notes (Signed)
    Daily Group Progress Note  Program: IOP  Group Time: 9am-12pm  Participation Level: Active  Behavioral Response: Appropriate, Sharing and Motivated  Type of Therapy:  Group Therapy; process group, psycho-educational group  Summary of Progress:  The purpose of this group is to utilize CBT in a group setting to increase use of healthy coping skills and decrease intensity of active mental health symtpoms.  9am-10:30am Clinician checked in with clients, reviewing daily inventory and assessing for SI/HI/psychosis. Clinician presented 'I Am Poem' focused on identifying parts of self and areas of desired growth. Clinician and group members processed easy and difficult parts of completing activity and which automatic thoughts about self brought up feelings. Clinician provided clients with educational video on improving the feelings of self-worth by using self compassion to cope with self criticism. Clinician and group members processed automatic thoughts of self and from which experiences these were created. Clinician actively listened to clients providing summarizing statements and validating difficulty of identifying and addressing desired changes in self.  10:30am-12pm Clinician and group members reviewed types of self compassion excercises which can be added into daily life. Clinician and group members participated in progressive muscle relaxation skill to address feelings of anxiety. Clinician requested group members share a self-care activity planned for the rest of the day. Clinician reminded clients that self care includes making self a priority, not an afterthought which includes eating, drinking water, improving sleep hygiene.  Client presents as a stepdown from CDIOP after the death of multiple family members. Client identified frustration with having to re-learn to think and speak positively toward herself as it is a skill she can use for others. Client participated in group activities and  was supportive of group members. Client was able to use assertive communication in agreeing with others when a break was needed due to overwhelming, uncomfortable feelings. Client reports mindfulness skills are most helpful currently in managing symptoms of anxiety, though identifies she has been keeping herself busy and displaying some avoiding behaviors related to grief and loss.  Harlon Ditty, LCSW

## 2018-06-01 NOTE — Telephone Encounter (Signed)
D:  Pt excused today due to a previous scheduled appointment to take her mother to [facility] for a procedure.  A:  Plans to return tomorrow.  Inform treatment team.

## 2018-06-02 ENCOUNTER — Other Ambulatory Visit (HOSPITAL_COMMUNITY): Payer: 59 | Admitting: Psychiatry

## 2018-06-02 ENCOUNTER — Encounter (HOSPITAL_COMMUNITY): Payer: Self-pay | Admitting: Family

## 2018-06-02 ENCOUNTER — Telehealth (HOSPITAL_COMMUNITY): Payer: Self-pay | Admitting: Psychiatry

## 2018-06-02 ENCOUNTER — Encounter (HOSPITAL_COMMUNITY): Payer: Self-pay

## 2018-06-05 ENCOUNTER — Other Ambulatory Visit (HOSPITAL_COMMUNITY): Payer: 59 | Admitting: Psychiatry

## 2018-06-05 ENCOUNTER — Telehealth (HOSPITAL_COMMUNITY): Payer: Self-pay | Admitting: Psychiatry

## 2018-06-06 ENCOUNTER — Other Ambulatory Visit (HOSPITAL_COMMUNITY): Payer: 59 | Admitting: Licensed Clinical Social Worker

## 2018-06-06 DIAGNOSIS — F3181 Bipolar II disorder: Secondary | ICD-10-CM

## 2018-06-06 DIAGNOSIS — G4700 Insomnia, unspecified: Secondary | ICD-10-CM

## 2018-06-06 MED ORDER — SERTRALINE HCL 25 MG PO TABS: 25.0000 mg | ORAL_TABLET | Freq: Every day | ORAL | 0 refills | Status: DC

## 2018-06-06 MED ORDER — ARIPIPRAZOLE 5 MG PO TABS: 5.0000 mg | ORAL_TABLET | Freq: Every day | ORAL | 0 refills | Status: DC

## 2018-06-06 NOTE — Progress Notes (Signed)
BH MD/PA/NP OP Progress Note  06/07/2018 8:54 AM Alexis Mccullough  MRN:  [id number]   Evaluation: Alexis Mccullough presents flat, guarded and irritable.  She reports her mood has been  fluctuating daily. States she is depressed and tearful for most of the day. Shakora reports she feels like people or something is watching her.  Denies auditory or visual hallucinations denies suicidal or homicidal ideations.  Patient does report worsening paranoia, [name] does indicate a history of paranoid ideations as she  states she was prescribed Wellbutrin, Zoloft and Abilify in the past. Patient denies that she is followed by psychiatry currently.Patient reports previous diagnosis of bipolar 1, PTSD, depression, ADHD and substance abuse use.  However has not been taking medications as prescribed.  Does report drinking a bottle of alcohol over the weekend and is unable to identify any major stressors during this assessment.  Patient is requesting to be restarted on her antidepressants.   History:Alexis Mccullough 38 year old African-American female presents after recently transitioning from chemical dependency intensive outpatient program.  Patient reports multiple stressors related to her worsening depression and anxiety symptoms.  States the passing of her maternal aunt 2 weeks ago.  Reports close relationship.  Alexis Mccullough expressed unresolved grief and depression since 2014 when she lost her fianc.  Visit Diagnosis:    ICD-10-CM   1. Bipolar 2 disorder (HCC) F31.81   2. Insomnia, unspecified type G47.00     Past Psychiatric History: Multiple medication adjustments.  Reported abuse to Adderall for ADHD.  Reports substance abuse to cocaine alcohol and marijuana.  Reports recent relapse.  Reports she attended rehabilitation for substance abuse.  Past Medical History:  Past Medical History:  Diagnosis Date  . ADHD (attention deficit hyperactivity disorder) 08/29/2013  . Anxiety   . Bipolar 2 disorder (HCC) 11/21/2015  . Cannabis  dependence, continuous abuse (HCC) 03/16/2014  . Chronic kidney disease 12/03/2011   Hx of elevated protein in urine.  . Depression   . Diabetes mellitus without complication (HCC)   . Family history of drug addiction 08/29/2013  . HSV (herpes simplex virus) anogenital infection 06/18/2015   Last Assessment & Plan:  Classic presentation with known positive serology.  WIll treat as recurrent episode.  Discussed supportive care and red flags for return.  Written patient instructions provided.  Std screening today  . HTN (hypertension)   . Morbid (severe) obesity due to excess calories (HCC) 11/21/2015  . Obesity   . Obesity   . 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 Shortness of breath    with exertion   History reviewed. No pertinent surgical history.  Family Psychiatric History:   Family History:  Family History  Problem Relation Age of Onset  . Bipolar disorder Mother   . Hypertension Mother   . Stroke Mother   . Anxiety disorder Sister   . Hypertension Sister   . Hypertension Father   . Cancer Maternal Aunt        breast  . Hypertension Maternal Aunt   . Hypertension Paternal Aunt   . Cancer Maternal Grandmother        lung  . Stroke Maternal Grandfather   . Cancer Paternal Grandmother        reast  . Stroke Paternal Grandmother     Social History:  Social History   Socioeconomic History  . Marital status: Single    Spouse name: Not on file  . Number of children: Not on file  . Years of education:  Not on file  . Highest education level: Not on file  Occupational History  . Not on file  Social Needs  . Financial resource strain: Not on file  . Food insecurity:    Worry: Not on file    Inability: Not on file  . Transportation needs:    Medical: Not on file    Non-medical: Not on file  Tobacco Use  . Smoking status: Current Some Day Smoker    Packs/day: 0.25    Types: Cigarettes  . Smokeless tobacco: Never Used  Substance  and Sexual Activity  . Alcohol use: Yes    Comment: Admits to drinking wine during course of attending funerals  . Drug use: Yes    Types: Hydrocodone, Benzodiazepines  . Sexual activity: Yes    Birth control/protection: Injection  Lifestyle  . Physical activity:    Days per week: Not on file    Minutes per session: Not on file  . Stress: Not on file  Relationships  . Social connections:    Talks on phone: Not on file    Gets together: Not on file    Attends religious service: Not on file    Active member of club or organization: Not on file    Attends meetings of clubs or organizations: Not on file    Relationship status: Not on file  Other Topics Concern  . Not on file  Social History Narrative  . Not on file    Allergies:  Allergies  Allergen Reactions  . Strawberry Extract Swelling    Tongue, throat, lips  Tongue, throat, lips  Tongue, throat, lips   . Amoxicillin   . Pineapple Swelling    Tongue, lips, and throat   . Tomato Itching  . Penicillins Rash    Metabolic Disorder Labs: No results found for: HGBA1C, MPG No results found for: PROLACTIN No results found for: CHOL, TRIG, HDL, CHOLHDL, VLDL, LDLCALC No results found for: TSH  Therapeutic Level Labs: No results found for: LITHIUM No results found for: VALPROATE No components found for:  CBMZ  Current Medications: Current Outpatient Medications  Medication Sig Dispense Refill  . apixaban (ELIQUIS) 5 MG TABS tablet Take 5 mg by mouth.    . ARIPiprazole (ABILIFY) 5 MG tablet Take 1 tablet (5 mg total) by mouth daily. 30 tablet 0  . atomoxetine (STRATTERA) 60 MG capsule Take 1 capsule (60 mg total) by mouth daily. 30 capsule 2  . cyclobenzaprine (FLEXERIL) 10 MG tablet Take by mouth.    . EPINEPHrine 0.3 mg/0.3 mL IJ SOAJ injection Inject into the skin.    . hydrochlorothiazide (HYDRODIURIL) 25 MG tablet TAKE 1 TABLET BY MOUTH EVERY DAY    . hydrOXYzine (VISTARIL) 25 MG capsule Take 1 capsule (25 mg  total) by mouth every 4 (four) hours as needed for up to 360 doses. 120 capsule 2  . lisinopril (PRINIVIL,ZESTRIL) 5 MG tablet Take by mouth.    . meclizine (ANTIVERT) 50 MG tablet Take by mouth.    . medroxyPROGESTERone (DEPO-PROVERA) 150 MG/ML injection Inject 150 mg into the muscle. Every 3 months    . pregabalin (LYRICA) 150 MG capsule Take 1 capsule (150 mg total) by mouth 3 (three) times daily. 90 capsule 2  . sertraline (ZOLOFT) 25 MG tablet Take 1 tablet (25 mg total) by mouth daily. 30 tablet 0  . traZODone (DESYREL) 50 MG tablet Take 1 tablet (50 mg total) by mouth at bedtime. 30 tablet 0  . valACYclovir (VALTREX)  500 MG tablet Take 1 tablet twice daily for 3 days with outbreaks 30 tablet 1   No current facility-administered medications for this visit.      Musculoskeletal: Strength & Muscle Tone: within normal limits Gait & Station: normal Patient leans: Right  Psychiatric Specialty Exam: ROS  There were no vitals taken for this visit.There is no height or weight on file to calculate BMI.  General Appearance: Casual and Guarded  Eye Contact:  Fair  Speech:  Clear and Coherent  Volume:  Normal  Mood:  Anxious, Depressed and Irritable  Affect:  Congruent  Thought Process:  Coherent  Orientation:  Full (Time, Place, and Person)  Thought Content: Hallucinations: None   Suicidal Thoughts:  No  Homicidal Thoughts:  No  Memory:  Immediate;   Fair Recent;   Fair Remote;   Fair  Judgement:  Fair  Insight:  Fair  Psychomotor Activity:  Normal  Concentration:  Concentration: Poor  Recall:  [employer] of Knowledge: Fair  Language: Fair  Akathisia:  No  Handed:  Right  AIMS (if indicated):   Assets:  Communication Skills Desire for Improvement Resilience Social Support  ADL's:  Intact  Cognition: WNL  Sleep:  Fair   Screenings: AUDIT     Counselor from 03/02/2018 in BEHAVIORAL HEALTH INTENSIVE CHEMICAL DEPENDENCY  Alcohol Use Disorder Identification Test Final  Score (AUDIT)  12    GAD-7     Counselor from 04/26/2018 in BEHAVIORAL HEALTH INTENSIVE CHEMICAL DEPENDENCY Counselor from 04/20/2018 in BEHAVIORAL HEALTH INTENSIVE CHEMICAL DEPENDENCY Counselor from 03/16/2018 in BEHAVIORAL HEALTH INTENSIVE CHEMICAL DEPENDENCY Counselor from 03/02/2018 in BEHAVIORAL HEALTH INTENSIVE CHEMICAL DEPENDENCY  Total GAD-7 Score  20  (Pended)   14  20  21     PHQ2-9     Counselor from 04/26/2018 in BEHAVIORAL HEALTH INTENSIVE CHEMICAL DEPENDENCY Counselor from 04/20/2018 in BEHAVIORAL HEALTH INTENSIVE CHEMICAL DEPENDENCY Counselor from 03/16/2018 in BEHAVIORAL HEALTH INTENSIVE CHEMICAL DEPENDENCY Counselor from 03/02/2018 in BEHAVIORAL HEALTH INTENSIVE CHEMICAL DEPENDENCY  PHQ-2 Total Score  6  3  4  6   PHQ-9 Total Score  24  12  19  26        Assessment and Plan:  Continue intensive outpatient program IOP Initiated Abilify 5 mg and Zoloft 25 mg  Continue trazodone 50 mg p.o. nightly  Treatment plan was reviewed and agreed upon by NP T. Lewis and patient Shaquela Keesey need for continued group services  [name], NP 06/07/2018, 8:54 AM

## 2018-06-07 ENCOUNTER — Encounter (HOSPITAL_COMMUNITY): Payer: Self-pay | Admitting: Family

## 2018-06-07 ENCOUNTER — Other Ambulatory Visit (HOSPITAL_COMMUNITY): Payer: 59 | Admitting: Licensed Clinical Social Worker

## 2018-06-07 DIAGNOSIS — F3181 Bipolar II disorder: Secondary | ICD-10-CM

## 2018-06-07 NOTE — Progress Notes (Signed)
Preauthorization for Abilify completed 06/07/2018

## 2018-06-08 ENCOUNTER — Encounter (HOSPITAL_COMMUNITY): Payer: Self-pay

## 2018-06-08 ENCOUNTER — Other Ambulatory Visit (HOSPITAL_COMMUNITY): Payer: 59

## 2018-06-09 ENCOUNTER — Other Ambulatory Visit (HOSPITAL_COMMUNITY): Payer: 59 | Admitting: Licensed Clinical Social Worker

## 2018-06-09 DIAGNOSIS — F3181 Bipolar II disorder: Secondary | ICD-10-CM
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
## 2018-06-10 NOTE — Progress Notes (Signed)
    Daily Group Progress Note  Program: IOP  Group Time: 9am-12pm  Participation Level: Active  Behavioral Response: Appropriate, Sharing and Motivated  Type of Therapy:  Group Therapy; psycho-educational group, process group  Summary of Progress:  The purpose of this group is to utilize CBT in a group setting to increase use of healthy coping skills and decrease frequency and intensity of active mental health symptoms.  9am-10:30am Clinician checked in with clients, inquiring about what went well yesterday, and what could have gone better. Clinician presented the topic of automatic negative thoughts. Clinician reviewed CBT triangle and connection of thoughts, feelings, and behaviors. Clinician and clients reviewed several common cognitive distortions and discussed examples and alternative thoughts.  10:30am-12pm Clinician and group members processed difficulties with implementing skills in 'real world' settings, especially when attempting to try to communication styles. Clinician and group members processed barriers to change and ways to help engage others in supporting recovery and changes. Clinician and group members participated in Guided Meditation and progressive muscle relaxation activities. Clinician inquired about self care activity planned for the rest of the day. Client continues to struggle with mood swings an sleep as well as some paranoia which she reports as a new symptom. Client participates in group discussions and activities. Client notes she finds meditation helpful and includes this skill frequently. Client notes she often using 5 senses grounding skill when feeling overwhelmed.  Harlon DittyKarissa A Keyaira Clapham, LCSW

## 2018-06-12 ENCOUNTER — Other Ambulatory Visit (HOSPITAL_COMMUNITY): Payer: 59 | Attending: Psychiatry | Admitting: Licensed Clinical Social Worker

## 2018-06-12 DIAGNOSIS — F3181 Bipolar II disorder: Secondary | ICD-10-CM | POA: Insufficient documentation

## 2018-06-12 DIAGNOSIS — F419 Anxiety disorder, unspecified: Secondary | ICD-10-CM | POA: Insufficient documentation

## 2018-06-13 ENCOUNTER — Other Ambulatory Visit (HOSPITAL_COMMUNITY): Payer: 59 | Admitting: Licensed Clinical Social Worker

## 2018-06-13 DIAGNOSIS — F3181 Bipolar II disorder: Secondary | ICD-10-CM | POA: Diagnosis not present

## 2018-06-13 NOTE — Progress Notes (Signed)
    Daily Group Progress Note  Program: IOP  Group Time: 9am-12pm  Participation Level: Active  Behavioral Response: Appropriate, Sharing and Motivated  Type of Therapy: Group Therapy; process group, psycho-educational group ? Summary of Progress:  The purpose of this group is to utilize CBT and DBT skills in a group setting to increase use of healthy coping skills and decrease frequency and intensity of active mental health symptoms.  9am-11am Clinician checked in with clients and processed recent stressors and skills used. Clinician actively listened to clients and validated feelings. Clinician provided some reframing and redirecting. Clinician utilized summarizing statements and Socratic questioning learned in previous session to challenge clients thoughts and opportunities for skills use.  11am-12pm Clinician presented STOPP skill to address some ruminating thoughts and allow a mindful moment for clients to pause and to respond rather than react. Clinician praised clients use of I-statements to identify related thoughts and feelings and connection with previous behaviors and values. Client shared about dynamics in new relationships and how they differed from previously, unhealthy relationships. Client identified new boundaries set in place by herself as well as observing other demonstrate appropriate boundaries, effective communication, and overall respectful behavior. Client provided support for group members and was receptive to input, stating she often did not listen to female point of view however it has been helpful. Client discussed with group members having to break tasks down into smaller tasks as she focuses better when having multiple things to do. Client denies any substance use this day. Client current stage of change is action.    Harlon Ditty, LCSW

## 2018-06-13 NOTE — Progress Notes (Signed)
    Daily Group Progress Note  Program: IOP  Group Time: 9am-12pm  Participation Level: Active  Behavioral Response: Appropriate, Sharing and Motivated  Type of Therapy: Group Therapy; process group, psycho-educational group ? Summary of Progress:  The purpose of this group is to utilize CBT and DBT skills in a group setting to increase use of healthy coping skills and decrease frequency and intensity of active mental health symptoms.  9am-11am Clinician checked in with group members, assessing for SI/HI/psychosis, and any recent highs, lows, and skills used. Clinician and group members processed progress in therapy so far, and barriers to improvements. Clinician validated client feelings of frustration and some struggles with feeling a lack of support. Clinician facilitated discussion on the effects of distorted thinking on communication styles, relationships, boundaries, and expectations of others. Clinician reviewed Mind Traps from previous group to review common thought distortions and reviewed the connection between thoughts, feelings, and behaviors.  11am-12pm Clinician presented information on challenging negative thoughts to decrease negative self talk and increased symptoms of anxiety, depression, and irritability. Clinician presented questions for use in Socratic Questioning to provide clients with questions to challenge negative thoughts. Clinician and group members role played addressing examples of unhelpful thoughts in group.  Client describes feeling overwhelmed with familial tasks "Taking care of adults" and coping with multiple recent losses. Therapeutic interventions included assisting client in identifying adaptive coping strategies such as self-care and combatting negative self-talk by identifying "mind traps" and asking Socratic questions. Client was dressed in sweatpants and a hoodie and appeared tired at times. She acknowledged her change in attire and attributed it to being  tired from the previous day's tasks. Client participated in group by describing her previous negative behaviors and updating the group on how she has improved through communication and making more strides to be vulnerable. She has made progress in her behavior and managing stressful interactions. Client has described her apprehension with returning to work and plan to avoid isolation in this setting. Stage of change: Action  Harlon Ditty, LCSW

## 2018-06-13 NOTE — Progress Notes (Signed)
    Daily Group Progress Note  Program: IOP  Group Time: 9am-12pm  Participation Level: Active  Behavioral Response: Appropriate  Type of Therapy: Group Therapy; process group, pscyho-educational group ? Summary of Progress:  The purpose of this group is to utilize CBT and DBT skills in a group setting to increase use of healthy coping skills and decrease frequency and intensity of active mental health symptoms.  9am-10:30am Clinician checked in with group members, inquiring about something that went well yesterday and something that could have gone better. Clinician and group members reviewed topics of the week, including boundary setting, assertive communication, distorted thinking, and goal setting. Clinician presented examples of effective, assertive communication statements. Clinician and group members discussed the importance of practicing I-statements regularly in order to gain mastery over skill. Clinician and group members role played in group scenarios where alternative phrases could be used. Group members processed the importance of timing, tone, and phrasing in conversations and how things are received. Clinician and group members reviewed previous GIVE and FAST skills which encourage communication skills based on intended outcome of interaction. Clinician allowed group members to process several examples of interactions between significant others and feedback for alternative statements.  10:30am-12pm Clinician presented list of Coping Skills. Clinician and group members discussed the importance of adding some activities into every day life to help manage levels of uncomfortable emotions. Clinician and group members discussed which skills were most helpful based on which 'level' of emotional they were feeling at the moment. Clients processed differences in roles in family and community systems which gave added pressure to appropriate behaviors. Clients picked coping skill to attempt over  the weekend. Client participated appropriately in group discussions. Client requested feedback from group members related to boundaries of new relationships and was receptive to feedback. Client was able to show progress toward goal as evidence by utilizing assertive communication to interact with group members. Client reports continued sobriety and continues in Action stage of change.  Harlon Ditty, LCSW

## 2018-06-14 ENCOUNTER — Other Ambulatory Visit (HOSPITAL_COMMUNITY): Payer: 59 | Admitting: Licensed Clinical Social Worker

## 2018-06-14 DIAGNOSIS — F3181 Bipolar II disorder: Secondary | ICD-10-CM

## 2018-06-15 ENCOUNTER — Telehealth (HOSPITAL_COMMUNITY): Payer: Self-pay | Admitting: Psychiatry

## 2018-06-15 ENCOUNTER — Ambulatory Visit (HOSPITAL_COMMUNITY): Payer: 59 | Admitting: Psychiatry

## 2018-06-15 NOTE — Progress Notes (Signed)
    Daily Group Progress Note  Program: IOP  Group Time: 9am-12pm  Participation Level: Active  Behavioral Response: Appropriate, Sharing and Motivated  Type of Therapy: Group Therapy; process group, psycho-educational group  Summary of Progress:  The purpose of this group is to utilize CBT and DBT skills in a group setting to increase use of healthy coping skills and decrease frequency and intensity of active mental health symptoms.  9am-10:30 Clinician facilitated process group on anxiety related to upcoming changes, such as program discharge and returning to work. Clinician actively listened to group members using reflective statements and prompting use of socratic questions. Clinician validaded group member concerns surrounding transitions and encouraged group members to review grounding skills prior to returning to stressful situations.  10:30am-12pm Clinician provided psycho-educational information related to mindfulness and relationship between emotions and eating. Clinician and group members brainstormed options for other coping skills to use in place of eating, and the benefits of mindful eating. Clinician presented the skill of EFT or Tapping, focused on acknowledging problems and emotions, and showing self compassion and acceptance. Client processed need for boundary changes with family members. Client and other group members processed managing expectations of family members in order to avoid distressing emotions for extended periods of time, touching on radical acceptance. Client did not find tapping effective but did use the time for meditation which she reports using daily and is an effective coping skill for managing her uncomfortable feelings. Client reports continued sobriety from all substances since relapse related to the death of her mother, continuing in Action stage of change.  Harlon Ditty, LCSW

## 2018-06-16 ENCOUNTER — Encounter (HOSPITAL_COMMUNITY): Payer: 59 | Admitting: Psychiatry

## 2018-06-16 ENCOUNTER — Other Ambulatory Visit (HOSPITAL_COMMUNITY): Payer: 59 | Admitting: Psychiatry

## 2018-06-16 DIAGNOSIS — F3181 Bipolar II disorder: Secondary | ICD-10-CM | POA: Diagnosis not present

## 2018-06-16 NOTE — Patient Instructions (Signed)
D:  Patient successfully completed MH-IOP today.  A:  Discharge today.  Follow up with Dr. Jama Flavors on 06-26-18 @ 11 a.m and Dr. Lovie Chol (if she accepts pt's insurance).  Provided pt with other referrals also.  RTW on 06-26-18; without any restrictions.  R:  Patient receptive.
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
## 2018-06-16 NOTE — Progress Notes (Signed)
  [facility] Intensive Outpatient Program Discharge Summary  [name] [id number]  Admission date: May 31, 2018.  Discharge date: June 16, 2018.  Reason for admission: Alexis Mccullough is 38 year old African-American employed female who was transitioned from CD IOP to IOP as she was dealing with significant grief, depression and anxiety.  Patient known to this Clinical research associate from the past.  She was last seen in 2016.  Patient claimed that she was sober for few years until relapse in October.  Patient was in the CD IOP program and now finishing IOP.  Patient feel that program helped.  She is taking Abilify 5 mg few days ago.  She is also prescribed Zoloft which she is not taking it.  Was prescribed Strattera however she has not taken while she was in the IOP.  She plans to resume it in the future.  She takes trazodone to help her sleep.  Her current stressors are living with her biological mother.  She is also working full-time and taking care of her mother.  Patient do not recall any side effects of Abilify.  She denies any hallucination, paranoia or any suicidal thoughts.  Chemical Use History: Yes.  Please see information from CD IOP.  Family of Origin Issues: Viewed.  Progress in Program Toward Treatment Goals: Patient did better and IOP.  She feels that her coping skills are improved.  She like to continue Abilify which started last week.  She like to keep appointment with Dr. Jama Flavors which is scheduled for February 17.  Progress (rationale): Improved from the past.    [facility] 06/16/2018

## 2018-06-19 NOTE — Progress Notes (Signed)
    Daily Group Progress Note  Program: IOP  Group Time: 9am-12pm  Participation Level: Active  Behavioral Response: Appropriate  Type of Therapy: Group Therapy; process group, psycho-educational group ? Summary of Progress:  The purpose of this group is to utilize CBT and DBT skills in a group setting to increase use of healthy coping skills and decrease frequency and intensity of distressing mental health symptoms.  9am-10:30am Clinician checked in with group members, assessing for SI/HI/psychosis and overall level of functioning. Clinician inquired about completion of self care activity from the previous day. Clinician presented 'I am...' poem for clients to complete and discuss focusing on improving self awareness. Clinician facilitated process discussion related to relationship dynamics based on perspective of boundaries and values.  10:30am-12pm Clinician provided values clarification activity. Clinician and group members created crisis plans, to identify triggers, coping skills, and what supports are available and how they could be most helpful. Client completes IOP on this day, meeting with case manager and psychiatrist during group. Client reports group has been helpful in improving her self awareness and review of skills. Client continues to struggle with grief symptoms and feeling overwhelmed as a caretaker.   Harlon Ditty, LCSW

## 2018-06-26 ENCOUNTER — Ambulatory Visit (INDEPENDENT_AMBULATORY_CARE_PROVIDER_SITE_OTHER): Payer: 59 | Admitting: Psychiatry

## 2018-06-26 ENCOUNTER — Encounter (HOSPITAL_COMMUNITY): Payer: Self-pay | Admitting: Psychiatry

## 2018-06-26 VITALS — BP 141/85 | HR 80 | Ht 66.0 in | Wt 366.0 lb

## 2018-06-26 DIAGNOSIS — F3175 Bipolar disorder, in partial remission, most recent episode depressed: Secondary | ICD-10-CM

## 2018-06-26 MED ORDER — ARIPIPRAZOLE 10 MG PO TABS: 10.0000 mg | ORAL_TABLET | Freq: Every day | ORAL | 0 refills | Status: DC

## 2018-06-26 MED ORDER — GABAPENTIN 100 MG PO CAPS: 100.0000 mg | ORAL_CAPSULE | Freq: Two times a day (BID) | ORAL | 0 refills | Status: DC

## 2018-06-26 NOTE — Progress Notes (Signed)
Psychiatric Initial Adult Assessment   Patient Identification: Alexis Mccullough MRN:  [id number] Date of Evaluation:  06/26/2018 Referral Source: IOP  Chief Complaint:  " I had gone to IOP for depression, grief" Visit Diagnosis:  Bipolar Disorder II, depressed .  Substance Use Disorder, in early remission. History of Present Illness:  38, single , no children, employed, lives with/takes care of her mother who has MS/ Dementia.  Participated in IOP recently, and states she completed it on 06/16/18. Patient reports history of depression, and states she felt her depression was " creeping up" over recent months. She reports her depression was exacerbated by severe losses/ stressors, mainly the  recent death of several people she was very close to ( her aunt, who helped raise her, and whom she thought of as " my mother", an uncle, and her pastor all died within the last 3 months). She is also now taking care of her biological mother, who has chronic medical issues and moved in with her recently . Patient has a history of substance abuse, mainly cocaine, from which she has been sober x several years. She reports she had relapsed briefly on alcohol and cannabis in late 2019. She has now been sober x several weeks . Patient states she has made some improvement compared to how she felt , but still feels depressed, states  " I am still depressed",  " I am still grieving".   She is currently on Abilify 5 mgrs QDAY, started recently ( had been on it before), and on Strattera ( which she has been on for years ).  Endorses some neuro-vegetative symptoms as below,but denies suicidal ideations, and states she has been working on " taking better care of myself", has been going to a gym regularly.     Associated Signs/Symptoms: Depression Symptoms:  depressed mood, anxiety, loss of energy/fatigue, decreased appetite, (Hypo) Manic Symptoms: currently denies manic symptoms and none noted at this time Anxiety  Symptoms:  Intermittent anxiety symptoms ( palpitations, palms " get sweaty")  Psychotic Symptoms: none reported or noted at this time.  PTSD Symptoms: reports history of PTSD related to her BF dying in front of her ( 2013). Reports symptoms have improved overtime.   Past Psychiatric History: history of one prior psychiatric admission in 2007 for depression, which she states was triggered by losing an infant child. No history of suicide attempts, no history of self cutting, no history of psychosis. Reports she has been diagnosed with Bipolar Disorder in the past, and describes episodes of increased impulsivity, increased energy, increased spending .   Reports history of PTSD, which she states has improved overtime. Denies history of violence   Previous Psychotropic Medications: currently on Abilify 5 mgrs QDAY ( x 3 weeks) , Strattera 60 mgrs QDAY ( x several years) , Trazodone 50 mgrs QHS PRN for insomnia, which she takes irregularly.     Substance Abuse History in the last 12 months:  History of cocaine abuse  x2 years ( 2013-15).Also reports history of intermittent alcohol and cannabis abuse .  Reports she has been sober from cocaine x several years. As above, she had a short lived relapse on alcohol and cannabis in late 2019, but is now sober x several weeks.   Consequences of Substance Abuse: As above   Past Medical History: HTN, states she  not diabetic but takes Victoza for weight loss. Remote history of DVT . Past Medical History:  Diagnosis Date  . ADHD (attention deficit hyperactivity disorder)  08/29/2013  . Anxiety   . Bipolar 2 disorder (HCC) 11/21/2015  . Cannabis dependence, continuous abuse (HCC) 03/16/2014  . Chronic kidney disease 12/03/2011   Hx of elevated protein in urine.  . Depression   . Diabetes mellitus without complication (HCC)   . Family history of drug addiction 08/29/2013  . HSV (herpes simplex virus) anogenital infection 06/18/2015   Last Assessment & Plan:   Classic presentation with known positive serology.  WIll treat as recurrent episode.  Discussed supportive care and red flags for return.  Written patient instructions provided.  Std screening today  . HTN (hypertension)   . Morbid (severe) obesity due to excess calories (HCC) 11/21/2015  . Obesity   . Obesity   . 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 Shortness of breath    with exertion   No past surgical history on file.  Family Psychiatric History: mother has history of Bipolar Disorder, no suicides in family. History of substance abuse in paternal extended family.  Family History:  States relationship with mother had been distant ( she was raised by an aunt , who passed away about a month ago) , but that at this time her mother is living with her , patient is helping take care of her. Has two younger sisters .  Family History  Problem Relation Age of Onset  . Bipolar disorder Mother   . Hypertension Mother   . Stroke Mother   . Anxiety disorder Sister   . Hypertension Sister   . Hypertension Father   . Cancer Maternal Aunt        breast  . Hypertension Maternal Aunt   . Hypertension Paternal Aunt   . Cancer Maternal Grandmother        lung  . Stroke Maternal Grandfather   . Cancer Paternal Grandmother        reast  . Stroke Paternal Grandmother     Social History:   Social History   Socioeconomic History  . Marital status: Single    Spouse name: Not on file  . Number of children: Not on file  . Years of education: Not on file  . Highest education level: Not on file  Occupational History  . Not on file  Social Needs  . Financial resource strain: Not on file  . Food insecurity:    Worry: Not on file    Inability: Not on file  . Transportation needs:    Medical: Not on file    Non-medical: Not on file  Tobacco Use  . Smoking status: Current Some Day Smoker    Packs/day: 0.25    Types: Cigarettes  . Smokeless tobacco:  Never Used  Substance and Sexual Activity  . Alcohol use: Yes    Comment: Admits to drinking wine during course of attending funerals  . Drug use: Yes    Types: Hydrocodone, Benzodiazepines  . Sexual activity: Yes    Birth control/protection: Injection  Lifestyle  . Physical activity:    Days per week: Not on file    Minutes per session: Not on file  . Stress: Not on file  Relationships  . Social connections:    Talks on phone: Not on file    Gets together: Not on file    Attends religious service: Not on file    Active member of club or organization: Not on file    Attends meetings of clubs or organizations: Not on file    Relationship status:  Not on file  Other Topics Concern  . Not on file  Social History Narrative  . Not on file    Additional Social History:   Allergies:   Allergies  Allergen Reactions  . Strawberry Extract Swelling    Tongue, throat, lips  Tongue, throat, lips  Tongue, throat, lips   . Amoxicillin   . Pineapple Swelling    Tongue, lips, and throat   . Tomato Itching  . Penicillins Rash    Metabolic Disorder Labs: No results found for: HGBA1C, MPG No results found for: PROLACTIN No results found for: CHOL, TRIG, HDL, CHOLHDL, VLDL, LDLCALC No results found for: TSH  Therapeutic Level Labs: No results found for: LITHIUM No results found for: CBMZ No results found for: VALPROATE  Current Medications: Current Outpatient Medications  Medication Sig Dispense Refill  . ARIPiprazole (ABILIFY) 5 MG tablet Take 1 tablet (5 mg total) by mouth daily. 30 tablet 0  . atomoxetine (STRATTERA) 60 MG capsule Take 1 capsule (60 mg total) by mouth daily. 30 capsule 2  . EPINEPHrine 0.3 mg/0.3 mL IJ SOAJ injection Inject into the skin.    . hydrochlorothiazide (HYDRODIURIL) 25 MG tablet TAKE 1 TABLET BY MOUTH EVERY DAY    . Insulin Pen Needle 32G X 4 MM MISC Inject subcutaneously daily.    Marland Kitchen. liraglutide (VICTOZA) 18 MG/3ML SOPN Inject 0.6mg  under your  skin daily for a week.  Then increase your victoza to 1.2mg  daily for a week.  Then increase to 1.8mg  daily.    Marland Kitchen. lisinopril (PRINIVIL,ZESTRIL) 5 MG tablet Take by mouth.    Marland Kitchen. NOVOFINE 32G X 6 MM MISC INJECT SUBCUTANEOUSLY DAILY.    . traZODone (DESYREL) 50 MG tablet Take 1 tablet (50 mg total) by mouth at bedtime. 30 tablet 0  . valACYclovir (VALTREX) 500 MG tablet Take 1 tablet twice daily for 3 days with outbreaks 30 tablet 1   No current facility-administered medications for this visit.     Musculoskeletal: Strength & Muscle Tone: within normal limits Gait & Station: normal Patient leans: N/A  Psychiatric Specialty Exam: ROS no headache, no chest pain, no shortness of breath, no nausea or vomiting, no fever or chills   There were no vitals taken for this visit.There is no height or weight on file to calculate BMI.  General Appearance: Well Groomed  Eye Contact:  Good  Speech:  Normal Rate  Volume:  Normal  Mood:  reports some persistent depression, but acknowledges partial improvement   Affect:  Appropriate and reactive   Thought Process:  Linear and Descriptions of Associations: Intact  Orientation:  Full (Time, Place, and Person)  Thought Content:  no hallucinations, no delusions   Suicidal Thoughts:  No denies suicidal or self injurious ideations, denies homicidal or violent ideations  Homicidal Thoughts:  No  Memory:  recent and remote grossly intact   Judgement:  Good  Insight:  Good  Psychomotor Activity:  Normal  Concentration:  Concentration: Good and Attention Span: Good  Recall:  Good  Fund of Knowledge:Good  Language: Good  Akathisia:  Negative  Handed:  Right  AIMS (if indicated):  No abnormal movements noted or reported   Assets:  Communication Skills Desire for Improvement Resilience  ADL's:  Intact  Cognition: WNL  Sleep:  Good   Screenings: AUDIT     Counselor from 03/02/2018 in BEHAVIORAL HEALTH INTENSIVE CHEMICAL DEPENDENCY  Alcohol Use Disorder  Identification Test Final Score (AUDIT)  12    GAD-7  Counselor from 04/26/2018 in BEHAVIORAL HEALTH INTENSIVE CHEMICAL DEPENDENCY Counselor from 04/20/2018 in BEHAVIORAL HEALTH INTENSIVE CHEMICAL DEPENDENCY Counselor from 03/16/2018 in BEHAVIORAL HEALTH INTENSIVE CHEMICAL DEPENDENCY Counselor from 03/02/2018 in BEHAVIORAL HEALTH INTENSIVE CHEMICAL DEPENDENCY  Total GAD-7 Score  20  (Pended)   14  20  21     PHQ2-9     Counselor from 04/26/2018 in BEHAVIORAL HEALTH INTENSIVE CHEMICAL DEPENDENCY Counselor from 04/20/2018 in BEHAVIORAL HEALTH INTENSIVE CHEMICAL DEPENDENCY Counselor from 03/16/2018 in BEHAVIORAL HEALTH INTENSIVE CHEMICAL DEPENDENCY Counselor from 03/02/2018 in BEHAVIORAL HEALTH INTENSIVE CHEMICAL DEPENDENCY  PHQ-2 Total Score  6  3  4  6   PHQ-9 Total Score  24  12  19  26       Assessment and Plan:  38 year old female, reports prior diagnoses of bipolar disorder II, substance abuse. Reports a short-lived relapse in November 2019, and recently worsening depression and anxiety in the context of significant losses/stressors, including the death of an aunt she was very close to and of her pastor earlier this year.  Recently completed IOP and has been referred for further outpatient psychiatric management. At this time reports feeling noticeably better than she did before starting IOP, but still struggling with depression, anxiety, occasional vague panic type symptoms.  Denies suicidal ideations.  She is on Abilify at 5 mg daily, denies side effects, but feels it is not working significantly at current dose. We discussed options. Patient states Abilify well tolerated thus far, but does not feel it is working much at current dose. Has been on it before without side effects and good response . Interested in medication for anxiety, but prefers ( and Clinical research associate agrees) to avoid any potentially addictive medication such as BZD. Also prefers to avoid medication such as Seroquel, which was poorly  tolerated and associated with weight gain in the past. States that she has history of chronic lower back pain and expresses interest in Neurontin as a potential trial to help anxiety and help with pain.  Increase Abilify to 10 mgrs QDAY for mood disorder.  Start Neurontin 100 mgrs BID for anxiety ( may also help chronic back pain) . Side effects , including risk of sedation, and off label use reviewed. Regarding labs- patient states she had recent HgbA1C , TSH, and Lipid Panel done in December by her PCP and was informed were normal.  She will bring copy of results to next appointment.    Craige Cotta, MD 2/17/202011:14 AM

## 2018-07-24 ENCOUNTER — Ambulatory Visit (HOSPITAL_COMMUNITY): Payer: 59 | Admitting: Psychiatry

## 2018-08-02 NOTE — Progress Notes (Signed)
Patient ID: Alexis Mccullough, female   DOB: 17-Jan-1981, 38 y.o.   MRN: [id number] err

## 2019-03-02 ENCOUNTER — Other Ambulatory Visit: Payer: Self-pay

## 2019-03-02 ENCOUNTER — Telehealth (HOSPITAL_COMMUNITY): Payer: Self-pay | Admitting: Psychiatry

## 2019-03-02 ENCOUNTER — Ambulatory Visit (INDEPENDENT_AMBULATORY_CARE_PROVIDER_SITE_OTHER): Payer: 59 | Admitting: Psychiatry

## 2019-03-02 ENCOUNTER — Encounter (HOSPITAL_COMMUNITY): Payer: Self-pay | Admitting: Psychiatry

## 2019-03-02 DIAGNOSIS — F419 Anxiety disorder, unspecified: Secondary | ICD-10-CM | POA: Diagnosis not present

## 2019-03-02 DIAGNOSIS — F4321 Adjustment disorder with depressed mood: Secondary | ICD-10-CM | POA: Diagnosis not present

## 2019-03-02 DIAGNOSIS — F1210 Cannabis abuse, uncomplicated: Secondary | ICD-10-CM

## 2019-03-02 DIAGNOSIS — F319 Bipolar disorder, unspecified: Secondary | ICD-10-CM | POA: Diagnosis not present

## 2019-03-02 MED ORDER — TRAZODONE HCL 100 MG PO TABS: 100.0000 mg | ORAL_TABLET | Freq: Every day | ORAL | 0 refills | Status: DC

## 2019-03-02 MED ORDER — TRAZODONE HCL 50 MG PO TABS: 50.0000 mg | ORAL_TABLET | Freq: Every day | ORAL | 0 refills | Status: DC

## 2019-03-02 MED ORDER — CITALOPRAM HYDROBROMIDE 20 MG PO TABS: 20.0000 mg | ORAL_TABLET | Freq: Every day | ORAL | 2 refills | Status: DC

## 2019-03-02 MED ORDER — ARIPIPRAZOLE 5 MG PO TABS: 5.0000 mg | ORAL_TABLET | Freq: Every day | ORAL | 0 refills | Status: DC

## 2019-03-02 MED ORDER — ARIPIPRAZOLE 10 MG PO TABS: 10.0000 mg | ORAL_TABLET | Freq: Every day | ORAL | 0 refills | Status: DC

## 2019-03-02 MED ORDER — CITALOPRAM HYDROBROMIDE 20 MG PO TABS: 20.0000 mg | ORAL_TABLET | Freq: Every day | ORAL | 0 refills | Status: DC

## 2019-03-02 NOTE — Telephone Encounter (Signed)
D:  Dr. Adele Schilder referred pt to [place].  A:  Placed call to re-orient pt and provide her with the start date of 03-05-19.  Requested pt to email a copy of front and back side of her insurance card.  R:  Pt receptive.
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
## 2019-03-05 ENCOUNTER — Other Ambulatory Visit (HOSPITAL_COMMUNITY): Payer: 59 | Attending: Psychiatry | Admitting: Psychiatry

## 2019-03-05 ENCOUNTER — Other Ambulatory Visit (HOSPITAL_COMMUNITY): Payer: 59

## 2019-03-05 ENCOUNTER — Other Ambulatory Visit: Payer: Self-pay

## 2019-03-05 ENCOUNTER — Encounter (HOSPITAL_COMMUNITY): Payer: Self-pay | Admitting: Psychiatry

## 2019-03-05 DIAGNOSIS — F3181 Bipolar II disorder: Secondary | ICD-10-CM | POA: Insufficient documentation

## 2019-03-05 DIAGNOSIS — Z794 Long term (current) use of insulin: Secondary | ICD-10-CM | POA: Diagnosis not present

## 2019-03-05 DIAGNOSIS — F319 Bipolar disorder, unspecified: Secondary | ICD-10-CM

## 2019-03-05 DIAGNOSIS — E119 Type 2 diabetes mellitus without complications: Secondary | ICD-10-CM | POA: Diagnosis not present

## 2019-03-05 DIAGNOSIS — Z638 Other specified problems related to primary support group: Secondary | ICD-10-CM | POA: Diagnosis not present

## 2019-03-05 DIAGNOSIS — F17210 Nicotine dependence, cigarettes, uncomplicated: Secondary | ICD-10-CM | POA: Insufficient documentation

## 2019-03-05 DIAGNOSIS — Z9114 Patient's other noncompliance with medication regimen: Secondary | ICD-10-CM | POA: Diagnosis not present

## 2019-03-05 DIAGNOSIS — I10 Essential (primary) hypertension: Secondary | ICD-10-CM | POA: Diagnosis not present

## 2019-03-05 DIAGNOSIS — Z79899 Other long term (current) drug therapy: Secondary | ICD-10-CM | POA: Insufficient documentation

## 2019-03-05 DIAGNOSIS — R4587 Impulsiveness: Secondary | ICD-10-CM | POA: Diagnosis not present

## 2019-03-05 DIAGNOSIS — F419 Anxiety disorder, unspecified: Secondary | ICD-10-CM | POA: Insufficient documentation

## 2019-03-05 DIAGNOSIS — F22 Delusional disorders: Secondary | ICD-10-CM | POA: Diagnosis not present

## 2019-03-05 NOTE — Progress Notes (Signed)
Virtual Visit via Video Note  I connected with Alexis Mccullough on 03/05/19 at  9:00 AM EDT by a video enabled telemedicine application and verified that I am speaking with the correct person using two identifiers.  Location: Patient: Alexis Mccullough Provider: Lise Auer, LCSW   I discussed the limitations of evaluation and management by telemedicine and the availability of in person appointments. The patient expressed understanding and agreed to proceed.  History of Present Illness: Bipolar 2 DO   Observations/Objective: Case Manager checked in with all participants to review discharge dates, insurance authorizations, work-related documents and needs for the treatment team. Counselor processed current mood and functioning and discussed how participants spent their time since last session and if skills were applied. Today is Alexis Mccullough's first time in group, so she shared about herself, what brought her to therapy and what she is hoping to get from treatment. 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 She shared about losing 7 close family members over the past year, 4 due to [name] and that she recently was made aware that her father has stage 4 cancer. She would like to build coping skills to communicate better, heal from pain and loss, reestablish boundaries with others and build up healthy coping strategies. Alexis Mccullough presented with severe depression and moderate anxiety.  Counselor presented information on Restaurant manager, [employer] for [name]. Group members asked questions, engaged in discussion, shared ideas and strategies and identified ways the could apply the skills to their lives. Alexis Mccullough expressed appreciation for review of this topic. She reports being unsure how to spend her time now that she is no longer a caregiver. She realized that she is not taking her medications as  prescribed and that she has fallen behind on making and keeping her own appointments  Counselor wrapped up the session by having each group member shared their plans for the afternoon. She would like to take some time making a list of things she needs to do, calling providers to make appointments and starting to absorbing the information shared in group today.    Assessment and Plan: Counselor recommends that patient remains in IOP treatment to better manage mental health symptoms and continue to address treatment plan goals. Counselor recommends adherence to crisis/safety plan, taking medications as prescribed and following up with medical professionals if any issues arise.   Follow Up Instructions: Counselor will send Webex link for next session.    I discussed the assessment and treatment plan with the patient. The patient was provided an opportunity to ask questions and all were answered. The patient agreed with the plan and demonstrated an understanding of the instructions.   The patient was advised to call back or seek an in-person evaluation if the symptoms worsen or if the condition fails to improve as anticipated.  I provided 180 minutes of non-face-to-face time during this encounter.   Lise Auer, LCSW

## 2019-03-05 NOTE — Progress Notes (Signed)
Virtual Visit via Telephone Note  I connected with Alexis Mccullough on 03/05/19 at  9:00 AM EDT by telephone and verified that I am speaking with the correct person using two identifiers.   I discussed the limitations, risks, security and privacy concerns of performing an evaluation and management service by telephone and the availability of in person appointments. I also discussed with the patient that there may be a patient responsible charge related to this service. The patient expressed understanding and agreed to proceed.  I discussed the assessment and treatment plan with the patient. The patient was provided an opportunity to ask questions and all were answered. The patient agreed with the plan and demonstrated an understanding of the instructions.   The patient was advised to call back or seek an in-person evaluation if the symptoms worsen or if the condition fails to improve as anticipated.  I provided 15 minutes of non-face-to-face time during this encounter.   [name], NP    Psychiatric Initial Adult Assessment   Patient Identification: Alexis Mccullough MRN:  [id number] Date of Evaluation:  03/05/2019 Referral Source: Worsening depression after stopping her medications Chief Complaint:   Chief Complaint    Depression; Anxiety; Stress; Trauma     Visit Diagnosis:    ICD-10-CM   1. Bipolar I disorder (HCC)  F31.9     History of Present Illness: Alexis Mccullough 38 year-year-old African-American female.  Reports worsening depression and anxiety after stopping her medications.  She stated she was feeling better so she decided to discontinued all medications.  Reports she did not complete chemical dependency programming.  Reports more recently she found out that her father was  diagnosed with pancreatic cancer stage 4.  Stated she had to admit her mother into an assisted living facility due to her declining health and worsening dementia.  Reports multiple family stressors related  to past relationships.  Stated that she has been free of substance use since her last admission.  Patient reports she was followed by a therapist and a psychiatrist prior to her discharge.  However states she has not been in a while.  Reported mood irritability, worsening anxiety and depression.  Patient validates information provided and below psychiatric assessment on 03/02/2019.  Alexis Mccullough reports she was reinitiated on Abilify, Celexa and trazodone.  However states she has not been able to start  medications up due to financial issues.  Patient to be admitted into intensive outpatient programming on 03/05/2019    History:chemical dependency intensive outpatient program.  Intensive outpatient programming.  Treated for depression anxiety and bipolar 2 disorder.  Reports multiple family stressors related to grief and loss.Alexis Mccullough expressed unresolved grief and depression since 2014 when she lost her fianc.   Per psychiatry assessment on 03/02/2019 patient is a 38 year old African-American employed female who was last seen in 2016 and then she did see the IOP December 2019 now like to reestablish her care with [place].  Patient admitted that she has been noncompliant with medication and follow-up since she finished the program.  She stopped taking Abilify in [place] 2020.  Patient struggle with her mood.  She admitted crying spells, increased anxiety, irritability, crying spells.  She lost for close family member earlier this year due to Covid.  Her surrogate mother died in 07-04-2022 and her biological mother is now having dementia and she recently put her to assisted living facility on October 19.  Patient told she was having a lot of issues with her mother who she  has been taking care for past 2 years.  On October 7 she noticed that her mother threw her medication in a garbage can and that started arguments with her mother and she could not handle it.  She is relieved that her mother in now assisted living facility.   She is also concerned about her biological father who recently diagnosed with stage IV pancreatic cancer.  She is having a lot of grief about losing family members.  She only sleeps a few hours.  She is also concerned about her living.  She does not feel safe because of the neighborhood.  She endorses paranoia as she is having issues with the management and neighbors.   Associated Signs/Symptoms: Depression Symptoms:  depressed mood, feelings of worthlessness/guilt, difficulty concentrating, hopelessness, panic attacks, (Hypo) Manic Symptoms:  Distractibility, Impulsivity, Irritable Mood, Anxiety Symptoms:  Excessive Worry, Social Anxiety, Psychotic Symptoms:  Hallucinations: Visual PTSD Symptoms: NA  Past Psychiatric History:   Previous Psychotropic Medications: Yes   Substance Abuse History in the last 12 months:  Yes.    Consequences of Substance Abuse: NA  Past Medical History:  Past Medical History:  Diagnosis Date  . ADHD (attention deficit hyperactivity disorder) 08/29/2013  . Anxiety   . Bipolar 2 disorder ([place]) 11/21/2015  . Cannabis dependence, continuous abuse ([place]) 03/16/2014  . Chronic kidney disease 12/03/2011   Hx of elevated protein in urine.  . Depression   . Diabetes mellitus without complication ([place])   . Family history of drug addiction 08/29/2013  . HSV (herpes simplex virus) anogenital infection 06/18/2015   Last Assessment & Plan:  Classic presentation with known positive serology.  WIll treat as recurrent episode.  Discussed supportive care and red flags for return.  Written patient instructions provided.  Std screening today  . HTN (hypertension)   . Morbid (severe) obesity due to excess calories ([place]) 11/21/2015  . Obesity   . Obesity   . 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 Shortness of breath    with exertion   History reviewed. No pertinent surgical history.  Family Psychiatric History:   Family History:   Family History  Problem Relation Age of Onset  . Bipolar disorder Mother   . Hypertension Mother   . Stroke Mother   . Anxiety disorder Sister   . Hypertension Sister   . Hypertension Father   . Cancer Maternal Aunt        breast  . Hypertension Maternal Aunt   . Hypertension Paternal Aunt   . Cancer Maternal Grandmother        lung  . Stroke Maternal Grandfather   . Cancer Paternal Grandmother        reast  . Stroke Paternal Grandmother     Social History:   Social History   Socioeconomic History  . Marital status: Single    Spouse name: Not on file  . Number of children: Not on file  . Years of education: Not on file  . Highest education level: Not on file  Occupational History  . Not on file  Social Needs  . Financial resource strain: Not on file  . Food insecurity    Worry: Not on file    Inability: Not on file  . Transportation needs    Medical: Not on file    Non-medical: Not on file  Tobacco Use  . Smoking status: Current Some Day Smoker    Packs/day: 0.25    Types: Cigarettes  .  Smokeless tobacco: Never Used  Substance and Sexual Activity  . Alcohol use: Yes    Comment: Admits to drinking wine during course of attending funerals  . Drug use: Yes    Types: Hydrocodone, Benzodiazepines  . Sexual activity: Yes    Birth control/protection: Injection  Lifestyle  . Physical activity    Days per week: Not on file    Minutes per session: Not on file  . Stress: Not on file  Relationships  . Social Musicianconnections    Talks on phone: Not on file    Gets together: Not on file    Attends religious service: Not on file    Active member of club or organization: Not on file    Attends meetings of clubs or organizations: Not on file    Relationship status: Not on file  Other Topics Concern  . Not on file  Social History Narrative  . Not on file    Additional Social History:   Allergies:   Allergies  Allergen Reactions  . Strawberry Extract Swelling     Tongue, throat, lips  Tongue, throat, lips  Tongue, throat, lips   . Amoxicillin   . Pineapple Swelling    Tongue, lips, and throat   . Tomato Itching  . Penicillins Rash    Metabolic Disorder Labs: No results found for: HGBA1C, MPG No results found for: PROLACTIN No results found for: CHOL, TRIG, HDL, CHOLHDL, VLDL, LDLCALC No results found for: TSH  Therapeutic Level Labs: No results found for: LITHIUM No results found for: CBMZ No results found for: VALPROATE  Current Medications: Current Outpatient Medications  Medication Sig Dispense Refill  . ARIPiprazole (ABILIFY) 5 MG tablet Take 1 tablet (5 mg total) by mouth daily. 30 tablet 0  . citalopram (CELEXA) 20 MG tablet Take 1 tablet (20 mg total) by mouth daily. 30 tablet 0  . EPINEPHrine 0.3 mg/0.3 mL IJ SOAJ injection Inject into the skin.    . hydrochlorothiazide (HYDRODIURIL) 25 MG tablet Take by mouth.    . Insulin Pen Needle 32G X 4 MM MISC Inject subcutaneously daily.    Marland Kitchen. liraglutide (VICTOZA) 18 MG/3ML SOPN Inject 0.6mg  under your skin daily for a week.  Then increase your victoza to 1.2mg  daily for a week.  Then increase to 1.8mg  daily.    Marland Kitchen. NOVOFINE 32G X 6 MM MISC INJECT SUBCUTANEOUSLY DAILY.    . traZODone (DESYREL) 100 MG tablet Take 1 tablet (100 mg total) by mouth at bedtime. 30 tablet 0  . valACYclovir (VALTREX) 500 MG tablet Take 1 tablet twice daily for 3 days with outbreaks 30 tablet 1   No current facility-administered medications for this visit.     Musculoskeletal:   Psychiatric Specialty Exam: ROS  There were no vitals taken for this visit.There is no height or weight on file to calculate BMI.  General Appearance: NA  Eye Contact:  NA  Speech:  Clear and Coherent  Volume:  Normal  Mood:  Depressed  Affect:  Congruent and Depressed  Thought Process:  Coherent  Orientation:  Full (Time, Place, and Person)  Thought Content:  Logical  Suicidal Thoughts:  No  Homicidal Thoughts:  No  Memory:   Immediate;   Fair Recent;   Fair  Judgement:  Fair  Insight:  Fair  Psychomotor Activity:  Normal  Concentration:  Concentration: Fair  Recall:  [employer]Fair  Fund of Knowledge:Fair  Language: Fair  Akathisia:  No  Handed:  Right  AIMS (if indicated):  Assets:  Communication Skills Desire for Improvement Resilience Social Support  ADL's:  Intact  Cognition: WNL  Sleep:  Fair   Screenings: AUDIT     Counselor from 03/02/2018 in BEHAVIORAL HEALTH INTENSIVE CHEMICAL DEPENDENCY  Alcohol Use Disorder Identification Test Final Score (AUDIT)  12    GAD-7     Counselor from 04/26/2018 in BEHAVIORAL HEALTH INTENSIVE CHEMICAL DEPENDENCY Counselor from 04/20/2018 in BEHAVIORAL HEALTH INTENSIVE CHEMICAL DEPENDENCY Counselor from 03/16/2018 in BEHAVIORAL HEALTH INTENSIVE CHEMICAL DEPENDENCY Counselor from 03/02/2018 in BEHAVIORAL HEALTH INTENSIVE CHEMICAL DEPENDENCY  Total GAD-7 Score  20  (Pended)   14  20  21     PHQ2-9     Counselor from 04/26/2018 in BEHAVIORAL HEALTH INTENSIVE CHEMICAL DEPENDENCY Counselor from 04/20/2018 in BEHAVIORAL HEALTH INTENSIVE CHEMICAL DEPENDENCY Counselor from 03/16/2018 in BEHAVIORAL HEALTH INTENSIVE CHEMICAL DEPENDENCY Counselor from 03/02/2018 in BEHAVIORAL HEALTH INTENSIVE CHEMICAL DEPENDENCY  PHQ-2 Total Score  6  3  4  6   PHQ-9 Total Score  24  12  19  26       Assessment and Plan:  Admitted to Intensive Outpatient Programming  Continue medications as directed  Treatment plan was reviewed and agreed upon by NP T. Beverlie Kurihara and patient Alexis Mccullough need for group services.  , NP 10/26/202011:50 AM
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
## 2019-03-06 ENCOUNTER — Other Ambulatory Visit: Payer: Self-pay

## 2019-03-06 ENCOUNTER — Other Ambulatory Visit (HOSPITAL_COMMUNITY): Payer: 59 | Admitting: Psychiatry

## 2019-03-06 ENCOUNTER — Encounter (HOSPITAL_COMMUNITY): Payer: Self-pay | Admitting: Psychiatry

## 2019-03-06 DIAGNOSIS — F319 Bipolar disorder, unspecified: Secondary | ICD-10-CM

## 2019-03-06 DIAGNOSIS — F3181 Bipolar II disorder: Secondary | ICD-10-CM | POA: Diagnosis not present

## 2019-03-06 NOTE — Progress Notes (Signed)
Virtual Visit via Video Note  I connected with Alexis Mccullough on 03/06/19 at  9:00 AM EDT by a video enabled telemedicine application and verified that I am speaking with the correct person using two identifiers.  Location: Patient: Alexis Mccullough Provider: Lise Auer, LCSW   I discussed the limitations of evaluation and management by telemedicine and the availability of in person appointments. The patient expressed understanding and agreed to proceed.  History of Present Illness: Bipolar 1 DO   Observations/Objective: Case Manager checked in with all participants to review discharge dates, insurance authorizations, work-related documents and needs for the treatment team. Counselor processed current mood and functioning and discussed how participants spent their time since last session and if skills were applied. Alexis Mccullough shared that after group yesterday she slept from 12:30-5:30, then slept 10 more hours overnight. She stated that being able to open up in group yesterday helped her to relax and let go of some of her emotional stress. Alexis Mccullough presented with moderate to severe depression and moderate anxiety.   Counselor continued presentation of information on Restaurant manager, fast food for Better Mental Health: Optimizing Your Mind, [employer] and [employer]. Group members asked questions, engaged in discussion, shared ideas and strategies and identified ways they could apply the skills to their lives. Alexis Mccullough shared helpful quotes she uses to combat negativity in her life, that she is in need of setting up appointments for routine care/check ups, as she has been neglecting herself due to being in a caregiver roll. She worked on making a routine that incorporates self-care, productivity, reflection and better rest.   Counselor wrapped up the session by having each group member shared their plans for the afternoon. Alexis Mccullough plans to take a meal to her father, get lunch with a friend who has been  supportive financially and emotionally and plans to call providers to set up need appointments.    Assessment and Plan: Counselor recommends that patient remains in IOP treatment to better manage mental health symptoms and continue to address treatment plan goals. Counselor recommends adherence to crisis/safety plan, taking medications as prescribed and following up with medical professionals if any issues arise.   Follow Up Instructions: Counselor will send Webex link for next session.    I discussed the assessment and treatment plan with the patient. The patient was provided an opportunity to ask questions and all were answered. The patient agreed with the plan and demonstrated an understanding of the instructions.   The patient was advised to call back or seek an in-person evaluation if the symptoms worsen or if the condition fails to improve as anticipated.  I provided 180 minutes of non-face-to-face time during this encounter.   Lise Auer, LCSW

## 2019-03-07 ENCOUNTER — Other Ambulatory Visit (HOSPITAL_COMMUNITY): Payer: 59

## 2019-03-07 ENCOUNTER — Other Ambulatory Visit: Payer: Self-pay

## 2019-03-08 ENCOUNTER — Other Ambulatory Visit (HOSPITAL_COMMUNITY): Payer: 59 | Admitting: Psychiatry

## 2019-03-08 ENCOUNTER — Encounter (HOSPITAL_COMMUNITY): Payer: Self-pay | Admitting: Psychiatry

## 2019-03-08 ENCOUNTER — Other Ambulatory Visit: Payer: Self-pay

## 2019-03-08 ENCOUNTER — Ambulatory Visit (HOSPITAL_COMMUNITY): Payer: 59 | Admitting: Psychiatry

## 2019-03-08 DIAGNOSIS — F319 Bipolar disorder, unspecified: Secondary | ICD-10-CM

## 2019-03-08 DIAGNOSIS — F3181 Bipolar II disorder: Secondary | ICD-10-CM | POA: Diagnosis not present

## 2019-03-08 NOTE — Progress Notes (Signed)
Virtual Visit via Video Note  I connected with [name] on 03/08/19 at  9:00 AM EDT by a video enabled telemedicine application and verified that I am speaking with the correct person using two identifiers.  Location: Patient: Alexis Mccullough Provider: Lise Auer, LCSW   I discussed the limitations of evaluation and management by telemedicine and the availability of in person appointments. The patient expressed understanding and agreed to proceed.  History of Present Illness: Bipolar I Disorder   Observations/Objective: Case Manager checked in with all participants to review discharge dates, insurance authorizations, work-related documents and needs for the treatment team. Counselor processed current mood and functioning and discussed how participants spent their time since last session and if skills were applied. Jaemarie shared about the new care giving role she has began with her father. Sharonann discussed the new stressors and concerns she has. She identified that communication and boundaries need to be established to prevent her from burning out and continuing to impact her mental and physical health. Shalay presented with high anxiety and moderate depression.    Counselor engaged the group in taking time to finish their [place], then each group members shared about what they created and the meaning behind the visuals. Alberta shared that she was able to work on the activity but was not at a place emotionally to share about her work and processing. Counselor will revisit the activity with her upon next session to give more time for expression of emotion. Counselor allowed the graduating group members to choose a guided imagery, called Finding Your Authentic Self, which included progressive muscle relaxation, deep breathing, visualizations and positive affirmations. Group members shared about their experiences. Camil stated that this activity was difficult for her to fully engage in today  because she had many thoughts, feelings and distractions preventing relaxation and visualization.   Yina plans to work on a schedule to better organize the self-care time, care giving time, group time and personal time.   Assessment and Plan: Counselor recommends that patient remains in IOP treatment to better manage mental health symptoms and continue to address treatment plan goals. Counselor recommends adherence to crisis/safety plan, taking medications as prescribed and following up with medical professionals if any issues arise.   Follow Up Instructions: Counselor will send Webex link for next session.    I discussed the assessment and treatment plan with the patient. The patient was provided an opportunity to ask questions and all were answered. The patient agreed with the plan and demonstrated an understanding of the instructions.   The patient was advised to call back or seek an in-person evaluation if the symptoms worsen or if the condition fails to improve as anticipated.  I provided 150 minutes of non-face-to-face time during this encounter.   Lise Auer, LCSW

## 2019-03-09 ENCOUNTER — Other Ambulatory Visit: Payer: Self-pay

## 2019-03-09 ENCOUNTER — Other Ambulatory Visit (HOSPITAL_COMMUNITY): Payer: 59 | Admitting: Psychiatry

## 2019-03-09 ENCOUNTER — Encounter (HOSPITAL_COMMUNITY): Payer: Self-pay

## 2019-03-09 DIAGNOSIS — F319 Bipolar disorder, unspecified: Secondary | ICD-10-CM

## 2019-03-09 DIAGNOSIS — F3181 Bipolar II disorder: Secondary | ICD-10-CM | POA: Diagnosis not present

## 2019-03-09 NOTE — Progress Notes (Signed)
Virtual Visit via Video Note  I connected with Alexis Mccullough on 03/09/19 at  9:00 AM EDT by a video enabled telemedicine application and verified that I am speaking with the correct person using two identifiers.  Location: Patient: Alexis Mccullough Provider: Hilbert Odor, LCSW   I discussed the limitations of evaluation and management by telemedicine and the availability of in person appointments. The patient expressed understanding and agreed to proceed.  History of Present Illness: Bipolar 1 DO   Observations/Objective: Case Manager checked in with all participants to review discharge dates, insurance authorizations, work-related documents and needs for the treatment team. Counselor processed current mood and functioning and discussed how participants spent their time since last session and if skills were applied. Jermisha shared that she would like to refocus on herself, as she was needed for emergent care-giving for her father over the past few days. Orel expressed frustrations and dynamics that she would like to address with better communication and boundaries. She expressed a desire to be committed to this program and bettering her mental health and overall well-being. She shared about news she discovered this week regarding health conditions she is at risk for and how that is impacting her current outlook on well-being. Ronnica presented with high anxiety and moderate depression today.   Counselor engaged the group in an ice breaker activity, sharing if their personality is more connected with "tricking" or "treating". Group members shred their reflections on the question. 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 Counselor presented on the topic of "Self-Compassion", first asking them to write down thoughts about self-compassion then reviewing aspects of a research article by Haze Rushing, Ph. D as a group. Group members  shared in the open discussion on the topic offering support and implementation strategies with others. Rielynn stated identified that self-compassion to her is having empathy, grace, mercy, and curtesy for oneself. She realized that she needs more self-compassion and connection in her life. She is a natural caregiver and tends to neglect herself. She shared with others about her trauma work and overall journey with mental health treatment, as way of encouragement for herself and others.   Counselor engaged the group in the Self-Soothing Exercises which accompanied the article. Group members shared which self-soothing technique was most impactful for them. Kanasia preferred her hands over her heart and belly. She stated that she truly enjoyed the exercise and would like to implement it as a daily practice. Counselor ended group by group members sharing their plans for the day and weekend, as well as a self-care practice they can engage in. Solaris plans to rest this afternoon, determine her priority list and contact medical providers to set up past due appointments.   Assessment and Plan: Counselor recommends that patient remains in IOP treatment to better manage mental health symptoms and continue to address treatment plan goals. Counselor recommends adherence to crisis/safety plan, taking medications as prescribed and following up with medical professionals if any issues arise.   Follow Up Instructions: Counselor will send Webex link for next session.    I discussed the assessment and treatment plan with the patient. The patient was provided an opportunity to ask questions and all were answered. The patient agreed with the plan and demonstrated an understanding of the instructions.   The patient was advised to call back or seek an in-person evaluation if the symptoms worsen or if the condition fails to improve as  anticipated.  I provided 180 minutes of non-face-to-face time during this  encounter.   Alexis Auer, LCSW

## 2019-03-12 ENCOUNTER — Ambulatory Visit (HOSPITAL_COMMUNITY): Payer: 59 | Admitting: Psychiatry

## 2019-03-12 ENCOUNTER — Encounter (HOSPITAL_COMMUNITY): Payer: Self-pay | Admitting: Family

## 2019-03-12 ENCOUNTER — Other Ambulatory Visit (HOSPITAL_COMMUNITY): Payer: 59 | Attending: Psychiatry | Admitting: Family

## 2019-03-12 ENCOUNTER — Other Ambulatory Visit: Payer: Self-pay

## 2019-03-12 DIAGNOSIS — F1210 Cannabis abuse, uncomplicated: Secondary | ICD-10-CM | POA: Insufficient documentation

## 2019-03-12 DIAGNOSIS — F419 Anxiety disorder, unspecified: Secondary | ICD-10-CM | POA: Insufficient documentation

## 2019-03-12 DIAGNOSIS — F319 Bipolar disorder, unspecified: Secondary | ICD-10-CM | POA: Diagnosis present

## 2019-03-12 DIAGNOSIS — F3175 Bipolar disorder, in partial remission, most recent episode depressed: Secondary | ICD-10-CM

## 2019-03-12 MED ORDER — HYDROXYZINE PAMOATE 25 MG PO CAPS: 25.0000 mg | ORAL_CAPSULE | Freq: Three times a day (TID) | ORAL | 0 refills | Status: DC | PRN

## 2019-03-12 NOTE — Progress Notes (Signed)
Virtual Visit via Telephone Note  I connected with Alexis Mccullough on 03/12/19 at  9:00 AM EST by telephone and verified that I am speaking with the correct person using two identifiers.   I discussed the limitations, risks, security and privacy concerns of performing an evaluation and management service by telephone and the availability of in person appointments. I also discussed with the patient that there may be a patient responsible charge related to this service. The patient expressed understanding and agreed to proceed.    I discussed the assessment and treatment plan with the patient. The patient was provided an opportunity to ask questions and all were answered. The patient agreed with the plan and demonstrated an understanding of the instructions.   The patient was advised to call back or seek an in-person evaluation if the symptoms worsen or if the condition fails to improve as anticipated.  I provided 10 minutes of non-face-to-face time during this encounter.   Derrill Center, NP   Evaluation: Patient requested a follow-up evaluation due to medication confusion.  She reported the pharmacy has 2 different prescription available for her.  As discussed during initial admission assessment patient to start low-dose of medications due to noncompliance and restarting medications after 8 months of not taken medicaitons.  Patient stated pharmacy had old prescription available for pickup and probably accidentally refilled both prescriptions.  Patient to start Abilify 5 mg p.o. daily continue trazodone 50 mg p.o. nightly and Prozac 20 mg p.o. daily.  Patient is requesting something for " acute anxiety."  Reported ongoing stresses related to placing her mother and her nursing home.  States her father continues to battle cancer which is a main stressor for her.  NP will initiate hydroxyzine 25 mg p.o. 3 times daily as needed.  Patient was receptive to plan.  Support encouragement reassurance was  provided.  patient to continue intensive outpatient programming.

## 2019-03-12 NOTE — Progress Notes (Signed)
Virtual Visit via Video Note  I connected with Alexis Mccullough on 03/12/19 at  9:00 AM EST by a video enabled telemedicine application and verified that I am speaking with the correct person using two identifiers.  Location: Patient: Alexis Mccullough Provider: Lise Auer, LCSW   I discussed the limitations of evaluation and management by telemedicine and the availability of in person appointments. The patient expressed understanding and agreed to proceed.  History of Present Illness: Bipolar 1 DO   Observations/Objective: Case Manager checked in with all participants to review discharge dates, insurance authorizations, work-related documents and needs for the treatment team. Counselor processed current mood and functioning and discussed how participants spent their time since last session and if skills were applied. Alexis Mccullough shared that she started experiencing panic attacks over the weekend. She shared about the overwhelming stress she is experiencing being the a care-giving role for both her parents. She has a desire to focus on herself to address mental health and physical health needs. We dicussed boundary setting and communication to others for help. Counselor prompted Alexis Mccullough to engage in a self-soothing/midfulness exercise to reduce present anxiety. Alexis Mccullough presented with an high anxiety and moderate depression.   Counselor allowed the group to process thoughts and feelings about the upcoming election, as well as identifying coping strategies to manage reactions and interactions with others in the coming days. Alexis Mccullough shared that she has much anxiety and concerns about safety regarding reactions from others. Counselor provided the group with psychoeducation on Cognitive Distortions, sharing a video and covering 2 handouts. Group members each shared which distortions they most experience. Alexis Mccullough shared that she does filtering, global labeling, blaming, shoulds and the control fallacy. Counselor provided  group with strategies on challenging distorted thinking.   Counselor checked in with all group members to determine plan for self-care and productivity activities for the afternoon. Alexis Mccullough plans to spend time caring for her father and take a walk to relax her mind.   Assessment and Plan: Counselor recommends that patient remains in IOP treatment to better manage mental health symptoms and continue to address treatment plan goals. Counselor recommends adherence to crisis/safety plan, taking medications as prescribed and following up with medical professionals if any issues arise.   Follow Up Instructions: Counselor will send Webex link for next session.    I discussed the assessment and treatment plan with the patient. The patient was provided an opportunity to ask questions and all were answered. The patient agreed with the plan and demonstrated an understanding of the instructions.   The patient was advised to call back or seek an in-person evaluation if the symptoms worsen or if the condition fails to improve as anticipated.  I provided 180 minutes of non-face-to-face time during this encounter.   Lise Auer, LCSW

## 2019-03-13 ENCOUNTER — Other Ambulatory Visit (HOSPITAL_COMMUNITY): Payer: 59 | Admitting: Psychiatry

## 2019-03-13 ENCOUNTER — Other Ambulatory Visit: Payer: Self-pay

## 2019-03-13 ENCOUNTER — Telehealth (HOSPITAL_COMMUNITY): Payer: Self-pay | Admitting: Psychiatry

## 2019-03-14 ENCOUNTER — Other Ambulatory Visit: Payer: Self-pay

## 2019-03-14 ENCOUNTER — Other Ambulatory Visit (HOSPITAL_COMMUNITY): Payer: 59

## 2019-03-15 ENCOUNTER — Other Ambulatory Visit (HOSPITAL_COMMUNITY): Payer: 59 | Admitting: Psychiatry

## 2019-03-15 ENCOUNTER — Other Ambulatory Visit: Payer: Self-pay

## 2019-03-15 ENCOUNTER — Encounter (HOSPITAL_COMMUNITY): Payer: Self-pay

## 2019-03-15 DIAGNOSIS — F319 Bipolar disorder, unspecified: Secondary | ICD-10-CM | POA: Diagnosis not present

## 2019-03-15 NOTE — Progress Notes (Signed)
Virtual Visit via Video Note  I connected with Alexis Mccullough on 03/15/19 at  9:00 AM EST by a video enabled telemedicine application and verified that I am speaking with the correct person using two identifiers.  Location: Patient: Alexis Mccullough Provider: Lise Auer, LCSW   I discussed the limitations of evaluation and management by telemedicine and the availability of in person appointments. The patient expressed understanding and agreed to proceed.  History of Present Illness: MDD  Observations/Objective: Case Manager checked in with all participants to review discharge dates, insurance authorizations, work-related documents and needs for the treatment team. Case manager introduced guest speaker, Jeanella Craze, [place], to facilitate a discussion around Grief and Loss topics. Patient participated in discussion and shared insights about their own needs regarding this topic. Counselor allowed time for reflection and journaling on the topic of grief and loss, and promoted continuing this work in individual therapy.   Counselor facilitated a brief check in with group members to gauge mood and current functioning as well as their takeaways from the presentation. Alexis Mccullough shared that she wants to work on saying no more to others, so she can have more time for care and reflection related to her own needs. She presented with moderate depression and high anxiety. Counselor then allowed time for a graduating group member share about their overall progress in group and words of encouragement for those remaining in treatment. Group members and Counselor shared their observations and well wishes for the graduating client. All noted that she had a positive impact on their mental health journey as well.   Counselor introduced Field seismologist, Jan Fireman, Yoga Instructor, to guide the group in a yoga practice. Presenter checked in with all participants to assess the benefits. All group members  engaged in the practice and noted positive benefits from the yoga exercises.   Assessment and Plan: Counselor recommends that patient remains in IOP treatment to better manage mental health symptoms and continue to address treatment plan goals. Counselor recommends adherence to crisis/safety plan, taking medications as prescribed and following up with medical professionals if any issues arise.   Follow Up Instructions: Counselor will send Webex link for next session.    I discussed the assessment and treatment plan with the patient. The patient was provided an opportunity to ask questions and all were answered. The patient agreed with the plan and demonstrated an understanding of the instructions.   The patient was advised to call back or seek an in-person evaluation if the symptoms worsen or if the condition fails to improve as anticipated.  I provided 180 minutes of non-face-to-face time during this encounter.   Lise Auer, LCSW

## 2019-03-16 ENCOUNTER — Encounter (HOSPITAL_COMMUNITY): Payer: Self-pay

## 2019-03-16 ENCOUNTER — Other Ambulatory Visit: Payer: Self-pay

## 2019-03-16 ENCOUNTER — Other Ambulatory Visit (HOSPITAL_COMMUNITY): Payer: 59 | Admitting: Psychiatry

## 2019-03-16 DIAGNOSIS — F319 Bipolar disorder, unspecified: Secondary | ICD-10-CM | POA: Diagnosis not present

## 2019-03-16 NOTE — Progress Notes (Signed)
Virtual Visit via Video Note  I connected with Alexis Mccullough on 03/16/19 at  9:00 AM EST by a video enabled telemedicine application and verified that I am speaking with the correct person using two identifiers.  Location: Patient: Alexis Mccullough Provider: Lise Auer, LCSW   I discussed the limitations of evaluation and management by telemedicine and the availability of in person appointments. The patient expressed understanding and agreed to proceed.  History of Present Illness: Bipolar 1 DO   Observations/Objective: Case Manager checked in with all participants to review discharge dates, insurance authorizations, work-related documents and needs for the treatment team. Counselor processed current mood and functioning and discussed how participants spent their time since last session and if skills were applied. Alexis Mccullough shared that she was extremely exhausted today, due to caring for her father all day yesterday who is going through chemo for cancer treatment. Alexis Mccullough opened up about her challenges within her family unit. She identified cognitive distortions she exhibits with them. She is working on slowing down, implementing boundaries and finding her inner peace and balance in life. Alexis Mccullough presented with high anxiety and moderate depression.   Counselor allowed the group to decide what topic they most wanted discussed or covered. Group members decided that they needed to process how the election and societal factors are impacting their mental health, emotional health, relationships and overall well-being. All group members processed and contributed to the conversation in a respectful and supportive manner.   Counselor checked in with all group members to determine plan for self-care and productivity activities for the afternoon. Alexis Mccullough plans to "get things in order" this weekend with her personal needs and affairs. She wants to resist the urge of being the "savior" in the family. She has decided to  reduce her time on news outlets to reduce her stress.   Assessment and Plan: Counselor recommends that patient remains in IOP treatment to better manage mental health symptoms and continue to address treatment plan goals. Counselor recommends adherence to crisis/safety plan, taking medications as prescribed and following up with medical professionals if any issues arise.   Follow Up Instructions: Counselor will send Webex link for next session.    I discussed the assessment and treatment plan with the patient. The patient was provided an opportunity to ask questions and all were answered. The patient agreed with the plan and demonstrated an understanding of the instructions.   The patient was advised to call back or seek an in-person evaluation if the symptoms worsen or if the condition fails to improve as anticipated.  I provided 180 minutes of non-face-to-face time during this encounter.   Lise Auer, LCSW

## 2019-03-19 ENCOUNTER — Other Ambulatory Visit (HOSPITAL_COMMUNITY): Payer: 59 | Admitting: Psychiatry

## 2019-03-19 ENCOUNTER — Other Ambulatory Visit: Payer: Self-pay

## 2019-03-20 ENCOUNTER — Encounter (HOSPITAL_COMMUNITY): Payer: Self-pay

## 2019-03-20 ENCOUNTER — Other Ambulatory Visit: Payer: Self-pay

## 2019-03-20 ENCOUNTER — Other Ambulatory Visit (HOSPITAL_COMMUNITY): Payer: 59 | Admitting: Psychiatry

## 2019-03-20 DIAGNOSIS — F319 Bipolar disorder, unspecified: Secondary | ICD-10-CM | POA: Diagnosis not present

## 2019-03-20 NOTE — Progress Notes (Signed)
Virtual Visit via Video Note  I connected with Alexis Mccullough on 03/20/19 at  9:00 AM EST by a video enabled telemedicine application and verified that I am speaking with the correct person using two identifiers.  Location: Patient: Alexis Mccullough Provider: Lise Auer, LCSW   I discussed the limitations of evaluation and management by telemedicine and the availability of in person appointments. The patient expressed understanding and agreed to proceed.  History of Present Illness: Bipolar 1 DO   Observations/Objective: Case Manager checked in with all participants to review discharge dates, insurance authorizations, work-related documents and needs for the treatment team. Counselor processed current mood and functioning and discussed how participants spent their time since last session and if skills were applied. Alexis Mccullough shared that she was tired and exhausted from assisting her father during his cancer treatments yesterday. She admitted to lacking boundaries and effective communication with her family members. She identified that she has abandonment issues and a need to be accepted by her family. Counselor prompted self-care, slowing down, reflecting, journal, and to set aside time to communicate with others. Alexis Mccullough presented with high anxiety and moderate depression.  Counselor provided psychoeducation on Anxiety Vs Depression: The Power of Avoidance sharing a video and several handouts/articles on the cycle of anxiety, cycle of depression and coping strategies. Group members had open discussion about their anxiety and depressive symptoms, how they avoid in unhealthy ways, how they can get better at feeling, and helpful coping strategies. Alexis Mccullough shared that when her depression and anxiety overlaps she feels sadness, hopelessness, despair. She described her mental health symptoms as a Gumbo, with all the symptoms interacting with each other. She labels she depression and anxiety as "Functional" which  can be challenging because others are not aware of how she is truly being impacted.  Counselor checked in with all group members to determine plan for self-care and productivity activities for the afternoon. Alexis Mccullough plans to take a "great shower" which is relaxing and enjoyable for her.   Assessment and Plan: Counselor recommends that patient remains in IOP treatment to better manage mental health symptoms and continue to address treatment plan goals. Counselor recommends adherence to crisis/safety plan, taking medications as prescribed and following up with medical professionals if any issues arise.   Follow Up Instructions: Counselor will send Webex link for next session.    I discussed the assessment and treatment plan with the patient. The patient was provided an opportunity to ask questions and all were answered. The patient agreed with the plan and demonstrated an understanding of the instructions.   The patient was advised to call back or seek an in-person evaluation if the symptoms worsen or if the condition fails to improve as anticipated.  I provided 180 minutes of non-face-to-face time during this encounter.   Lise Auer, LCSW

## 2019-03-21 ENCOUNTER — Encounter (HOSPITAL_COMMUNITY): Payer: Self-pay

## 2019-03-21 ENCOUNTER — Other Ambulatory Visit: Payer: Self-pay

## 2019-03-21 ENCOUNTER — Other Ambulatory Visit (HOSPITAL_COMMUNITY): Payer: 59 | Admitting: Psychiatry

## 2019-03-21 DIAGNOSIS — F319 Bipolar disorder, unspecified: Secondary | ICD-10-CM

## 2019-03-21 NOTE — Progress Notes (Signed)
Virtual Visit via Video Note  I connected with Alexis Mccullough on 03/21/19 at  9:00 AM EST by a video enabled telemedicine application and verified that I am speaking with the correct person using two identifiers.  Location: Patient: Alexis Mccullough Provider: Lise Auer, LCSW   I discussed the limitations of evaluation and management by telemedicine and the availability of in person appointments. The patient expressed understanding and agreed to proceed.  History of Present Illness: Bipolar 1 DO   Observations/Objective: Case Manager checked in with all participants to review discharge dates, insurance authorizations, work-related documents and needs for the treatment team. Counselor processed current mood and functioning and discussed how participants spent their time since last session and if skills were applied. Alexis Mccullough shared that she over worked herself yesterday and that she intentionally plans self-care and focus on her needs today. Alexis Mccullough presented with moderate depression and high anxiety.   Counselor engaged the group in a [employer], prompting group members to share about helpful apps, community resources, websites, activities and other ideas that help them in maintaining healthy mental health. Counselor shared an exhaustive list of local, state, national, international and other relevant information and resources for Group Members to have access to now and after treatment. Counselor prompted all group members to share at least once resource they are interested in contacting or using now or in the future. Alexis Mccullough stated that she is interested in looking into [facility]habil[facility] services and the [facility].   Counselor checked in with all group members to determine plan for self-care and productivity activities for the afternoon. Antonio plans to rest, do a face mask and a long shower.   Assessment and Plan: Counselor recommends that patient remains in IOP  treatment to better manage mental health symptoms and continue to address treatment plan goals. Counselor recommends adherence to crisis/safety plan, taking medications as prescribed and following up with medical professionals if any issues arise.   Follow Up Instructions: Counselor will send Webex link for next session.    I discussed the assessment and treatment plan with the patient. The patient was provided an opportunity to ask questions and all were answered. The patient agreed with the plan and demonstrated an understanding of the instructions.   The patient was advised to call back or seek an in-person evaluation if the symptoms worsen or if the condition fails to improve as anticipated.  I provided 180 minutes of non-face-to-face time during this encounter.   Lise Auer, LCSW
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
## 2019-03-22 ENCOUNTER — Other Ambulatory Visit: Payer: Self-pay

## 2019-03-22 ENCOUNTER — Other Ambulatory Visit (HOSPITAL_COMMUNITY): Payer: 59

## 2019-03-23 ENCOUNTER — Ambulatory Visit (HOSPITAL_COMMUNITY): Payer: 59

## 2019-03-23 ENCOUNTER — Ambulatory Visit (HOSPITAL_COMMUNITY): Payer: 59 | Admitting: Psychiatry

## 2019-03-26 ENCOUNTER — Encounter (HOSPITAL_COMMUNITY): Payer: Self-pay | Admitting: Psychiatry

## 2019-03-26 ENCOUNTER — Other Ambulatory Visit (HOSPITAL_COMMUNITY): Payer: 59 | Admitting: Psychiatry

## 2019-03-26 ENCOUNTER — Other Ambulatory Visit: Payer: Self-pay

## 2019-03-26 DIAGNOSIS — F319 Bipolar disorder, unspecified: Secondary | ICD-10-CM | POA: Diagnosis not present

## 2019-03-26 NOTE — Progress Notes (Signed)
Virtual Visit via Video Note  I connected with Dion N Ulibarri on 03/26/19 at  9:00 AM EST by a video enabled telemedicine application and verified that I am speaking with the correct person using two identifiers.  Location: Patient: Alexis Mccullough Provider: Lise Auer, LCSW   I discussed the limitations of evaluation and management by telemedicine and the availability of in person appointments. The patient expressed understanding and agreed to proceed.  History of Present Illness: Bipolar 1 DO   Observations/Objective: Case Manager checked in with all participants to review discharge dates, insurance authorizations, work-related documents and needs for the treatment team. Counselor processed current mood and functioning and discussed how participants spent their time since last session and if skills were applied. Aluel reported that she is not feeling well today due to headache, runny nose and plans to be tested for COVID to rule out. Shenia reported on her caregiver role with her father and her mother, related to how it impacts her anxiety and overall health. Kaliyan shared about attempts for self-care over there weekend and how she experienced guilt, regret and shame for taking time out for herself. Autymn presents with severe anxiety and moderate depression.   Counselor recapped information discussed at the last session regarding an intro to managing workplace stress. Counselor checked in with the group members who were not present on Friday to get their feedback on their unique work-related stressors and what they enjoy about their jobs. Group members shared their reflections. Larosa shared a list of pros and cons to her work situation, being appreciative of how flexible and understanding they have been regarding her caregiver role.  Counselor provided a video discussing 10 unconventional ways to eliminate workplace stress and reviewed 3 articles with additional strategies. Group members shared  which strategies they would like to implement in the workplace as well as their personal lives. Carlisle identified that she would like to get back involved with NA and AA virtual groups, needs to talk with the case manager and nurse practitioner about her over all treatment and wants to look into setting up accommodations in the workplace.  Counselor then allowed time for a graduating group member share about their overall progress in group and words of encouragement for those remaining in treatment. Group members and Counselor shared their observations and well wishes for the graduating client. All noted that she had a positive impact on their mental health journey as well.   Assessment and Plan: Counselor recommends that patient remains in IOP treatment to better manage mental health symptoms and continue to address treatment plan goals. Counselor recommends adherence to crisis/safety plan, taking medications as prescribed and following up with medical professionals if any issues arise.   Follow Up Instructions: Counselor will send Webex link for next session.    I discussed the assessment and treatment plan with the patient. The patient was provided an opportunity to ask questions and all were answered. The patient agreed with the plan and demonstrated an understanding of the instructions.   The patient was advised to call back or seek an in-person evaluation if the symptoms worsen or if the condition fails to improve as anticipated.  I provided 180 minutes of non-face-to-face time during this encounter.   Lise Auer, LCSW

## 2019-03-27 ENCOUNTER — Other Ambulatory Visit: Payer: Self-pay

## 2019-03-27 ENCOUNTER — Other Ambulatory Visit (HOSPITAL_COMMUNITY): Payer: 59 | Admitting: Psychiatry

## 2019-03-28 ENCOUNTER — Other Ambulatory Visit (HOSPITAL_COMMUNITY): Payer: 59

## 2019-03-28 ENCOUNTER — Other Ambulatory Visit: Payer: Self-pay

## 2019-03-29 ENCOUNTER — Other Ambulatory Visit: Payer: Self-pay

## 2019-03-29 ENCOUNTER — Other Ambulatory Visit (HOSPITAL_COMMUNITY): Payer: 59 | Admitting: Psychiatry

## 2019-03-29 ENCOUNTER — Encounter (HOSPITAL_COMMUNITY): Payer: Self-pay

## 2019-03-29 DIAGNOSIS — F319 Bipolar disorder, unspecified: Secondary | ICD-10-CM

## 2019-03-29 NOTE — Progress Notes (Signed)
Virtual Visit via Video Note  I connected with Alexis Mccullough on 03/29/19 at  9:00 AM EST by a video enabled telemedicine application and verified that I am speaking with the correct person using two identifiers.  Location: Patient: Alexis Mccullough Provider: Lise Auer, LCSW   I discussed the limitations of evaluation and management by telemedicine and the availability of in person appointments. The patient expressed understanding and agreed to proceed.  History of Present Illness: Bipolar 1  Observations/Objective: Case Manager checked in with all participants to review discharge dates, insurance authorizations, work-related documents and needs for the treatment team. Case manager introduced guest speaker, Alexis Mcc[place], [place], to facilitate a discussion around Grief and Loss topics. Patient participated in discussion and shared insights about their own needs regarding this topic. Alexis Mccullough identified a variety of loss and grief areas that are impacting her overall mental health and perception of the world/people. She stated having breakthroughs during the conversation and wanting to spend more time in this area of healing. Counselor allowed time for reflection and journaling on the topic of grief/loss and promoted continuing this work in individual therapy.   Counselor facilitated a brief check in with group members to gage mood and current functioning as well as their takeaways from the presentation. Alexis Mccullough discussed stress related to waiting for COVID results and how her health has been impacted. She reported on improvements with her dad's care. We discussed personal self-care routine and benefits. She stated that she is feeling better and more stable since restarting her meds upon start of treatment.   Counselor engaged the group in an ice breaker to highlight positive affirmations as a way for cognitive coping and positive psychology.   Counselor introduced Field seismologist, Alexis Mccullough, Yoga Instructor, to guide the group in a yoga practice. Counselor checked in with all participants to assess the benefits.   Assessment and Plan: Counselor recommends that patient remains in IOP treatment to better manage mental health symptoms and continue to address treatment plan goals. Counselor recommends adherence to crisis/safety plan, taking medications as prescribed and following up with medical professionals if any issues arise.   Follow Up Instructions: Counselor will send Webex link for next session.    I discussed the assessment and treatment plan with the patient. The patient was provided an opportunity to ask questions and all were answered. The patient agreed with the plan and demonstrated an understanding of the instructions.   The patient was advised to call back or seek an in-person evaluation if the symptoms worsen or if the condition fails to improve as anticipated.  I provided 180 minutes of non-face-to-face time during this encounter.   Lise Auer, LCSW

## 2019-03-30 ENCOUNTER — Other Ambulatory Visit: Payer: Self-pay

## 2019-03-30 ENCOUNTER — Encounter (HOSPITAL_COMMUNITY): Payer: Self-pay

## 2019-03-30 ENCOUNTER — Encounter (HOSPITAL_COMMUNITY): Payer: Self-pay | Admitting: Family

## 2019-03-30 ENCOUNTER — Other Ambulatory Visit (HOSPITAL_COMMUNITY): Payer: 59 | Admitting: Psychiatry

## 2019-03-30 NOTE — Progress Notes (Signed)
Virtual Visit via Telephone Note  I connected with Alexis Mccullough on 03/30/19 at  9:00 AM EST by telephone and verified that I am speaking with the correct person using two identifiers.   I discussed the limitations, risks, security and privacy concerns of performing an evaluation and management service by telephone and the availability of in person appointments. I also discussed with the patient that there may be a patient responsible charge related to this service. The patient expressed understanding and agreed to proceed.    I discussed the assessment and treatment plan with the patient. The patient was provided an opportunity to ask questions and all were answered. The patient agreed with the plan and demonstrated an understanding of the instructions.   The patient was advised to call back or seek an in-person evaluation if the symptoms worsen or if the condition fails to improve as anticipated.  I provided 15 minutes of non-face-to-face time during this encounter.   Alexis Center, NP   [facility] Discharge Summary  Alexis Mccullough [id number]  Admission date: 03/02/2019 Discharge date:  03/30/2019  Reason for admission: Per admission assessment note: Alexis Mccullough 18 year-year-old African-American female.  Reports worsening depression and anxiety after stopping her medications.  She stated she was feeling better so she decided to discontinued all medications.  Reports she did not complete chemical dependency programming.  Reports more recently she found out that her father was  diagnosed with pancreatic cancer stage 4.  Stated she had to admit her mother into an assisted living facility due to her declining health and worsening dementia.  Reports multiple family stressors related to past relationships.  Stated that she has been free of substance use since her last admission.  Patient reports she was followed by a therapist and a psychiatrist prior  to her discharge.  However states she has not been in a while.  Reported mood irritability, worsening anxiety and depression.  Patient validates information provided and below psychiatric assessment on 03/02/2019.  Alexis Mccullough reports she was reinitiated on Abilify, Celexa and trazodone.  However states she has not been able to start  medications up due to financial issues.  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  States this has caused contention in her own life as she continues to be the primary care provider for her family.  Patient reports putting her family before her own mental health needs.  Alexis Mccullough continues to report worsening anxiety.  Progress in Program Toward Treatment Goals: Ongoing, patient attended and participated with daily group sessions sporadically.  During assessment patient reported " not in a good head space to receive the information at this time."  As she stated she had a lot on her plate and it was difficult to concentrate during group session this time around.  Continues to deny suicidal or homicidal ideations.  Denies auditory or visual hallucinations.  Progress (rationale): Keep follow-up appointment with Dr. Adele Schilder on 1123 at 9:20 AM and Dr. Vernona Rieger  on 1123 at 11 AM  Take all medications as prescribed. Keep all follow-up appointments as scheduled.  Do not consume alcohol or use illegal drugs while on prescription medications. Report any adverse effects from your medications to your primary care provider promptly.  In the event of recurrent symptoms or worsening  symptoms, call 911, a crisis hotline, or go to the nearest emergency department for evaluation.   Alexis Rack, NP 03/30/2019

## 2019-03-30 NOTE — Patient Instructions (Signed)
D:  Patient to d/c today.  A:  Follow up with Dr. Adele Schilder on 04-02-19 @ 9:20 a.m and Dr. Hart Carwin on 04-02-19 @ 11 a.m.  Encouraged support groups and [facility] (grief/loss counseling).  RTW on 04-09-19.  R:  Pt receptive.
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
## 2019-04-02 ENCOUNTER — Other Ambulatory Visit: Payer: Self-pay

## 2019-04-02 ENCOUNTER — Ambulatory Visit (INDEPENDENT_AMBULATORY_CARE_PROVIDER_SITE_OTHER): Payer: 59 | Admitting: Psychiatry

## 2019-04-02 ENCOUNTER — Encounter (HOSPITAL_COMMUNITY): Payer: Self-pay | Admitting: Psychiatry

## 2019-04-02 DIAGNOSIS — F419 Anxiety disorder, unspecified: Secondary | ICD-10-CM | POA: Diagnosis not present

## 2019-04-02 DIAGNOSIS — F1210 Cannabis abuse, uncomplicated: Secondary | ICD-10-CM

## 2019-04-02 DIAGNOSIS — F319 Bipolar disorder, unspecified: Secondary | ICD-10-CM | POA: Diagnosis not present

## 2019-04-02 MED ORDER — ARIPIPRAZOLE 5 MG PO TABS: 5.0000 mg | ORAL_TABLET | Freq: Every day | ORAL | 1 refills | Status: DC

## 2019-04-02 MED ORDER — CITALOPRAM HYDROBROMIDE 20 MG PO TABS: 20.0000 mg | ORAL_TABLET | Freq: Every day | ORAL | 1 refills | Status: DC

## 2019-04-02 MED ORDER — TRAZODONE HCL 100 MG PO TABS: 100.0000 mg | ORAL_TABLET | Freq: Every day | ORAL | 1 refills | Status: DC
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
## 2019-04-03 ENCOUNTER — Ambulatory Visit (HOSPITAL_COMMUNITY): Payer: 59

## 2019-04-04 ENCOUNTER — Ambulatory Visit (HOSPITAL_COMMUNITY): Payer: 59

## 2019-04-25 ENCOUNTER — Telehealth (HOSPITAL_COMMUNITY): Payer: Self-pay | Admitting: Psychiatry
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
## 2019-04-25 NOTE — Telephone Encounter (Signed)
D:  Stephanie from [facility] is calling inquiring about pt's RTW.  States she received the fax recommending that pt would return 04-09-19 (2:30 -11 pm); but pt hasn't returned and she wanted to know if she had missed something.  A:  Confirmed that pt was scheduled to rtw on 04-09-19 with those hrs.  Encouraged Colletta [place] to continue calling patient b/c this is unlike pt not to answer her phone.  Inform Dr. Adele Schilder.

## 2019-04-30 ENCOUNTER — Ambulatory Visit
Admission: EM | Admit: 2019-04-30 | Discharge: 2019-04-30 | Disposition: A | Discharge status: Home / Self Care | Payer: 59 | Attending: Physician Assistant | Admitting: Physician Assistant

## 2019-04-30 DIAGNOSIS — Z20822 Contact with and (suspected) exposure to covid-19: Secondary | ICD-10-CM

## 2019-04-30 DIAGNOSIS — R05 Cough: Secondary | ICD-10-CM

## 2019-04-30 DIAGNOSIS — Z20828 Contact with and (suspected) exposure to other viral communicable diseases: Secondary | ICD-10-CM

## 2019-04-30 DIAGNOSIS — R0982 Postnasal drip: Secondary | ICD-10-CM | POA: Diagnosis not present

## 2019-04-30 DIAGNOSIS — R059 Cough, unspecified: Secondary | ICD-10-CM

## 2019-04-30 MED ORDER — IPRATROPIUM BROMIDE 0.06 % NA SOLN: 2.0000 | Freq: Four times a day (QID) | NASAL | 0 refills | Status: DC

## 2019-04-30 MED ORDER — FLUTICASONE PROPIONATE 50 MCG/ACT NA SUSP: 2.0000 | Freq: Every day | NASAL | 0 refills | Status: AC

## 2019-04-30 NOTE — ED Triage Notes (Signed)
Pt c/o SOB, cough, fever, chills, and headache since last Thursday. Exposed to positive covid between 12/10-12/17.
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
## 2019-04-30 NOTE — Discharge Instructions (Addendum)
As discussed, given your exposure, I would like you to quarantine for 14 days from last exposure (ending on 05/07/2019) regardless of results. Per your preference, another COVID PCR testing ordered. You can take atrovent/flonase with nasal congestion/drainage/sinus pressure. If experiencing worsening shortness of breath, trouble breathing, go to the emergency department for further evaluation needed.

## 2019-05-01 LAB — NOVEL CORONAVIRUS, NAA: SARS-CoV-2, NAA: NOT DETECTED

## 2019-05-15 ENCOUNTER — Ambulatory Visit (INDEPENDENT_AMBULATORY_CARE_PROVIDER_SITE_OTHER): Payer: 59 | Admitting: Psychiatry

## 2019-05-15 ENCOUNTER — Other Ambulatory Visit: Payer: Self-pay

## 2019-05-15 ENCOUNTER — Encounter (HOSPITAL_COMMUNITY): Payer: Self-pay | Admitting: Psychiatry

## 2019-05-15 DIAGNOSIS — F1210 Cannabis abuse, uncomplicated: Secondary | ICD-10-CM | POA: Diagnosis not present

## 2019-05-15 DIAGNOSIS — F319 Bipolar disorder, unspecified: Secondary | ICD-10-CM | POA: Diagnosis not present

## 2019-05-15 DIAGNOSIS — F1220 Cannabis dependence, uncomplicated: Secondary | ICD-10-CM

## 2019-05-15 DIAGNOSIS — F1010 Alcohol abuse, uncomplicated: Secondary | ICD-10-CM

## 2019-05-15 DIAGNOSIS — F4321 Adjustment disorder with depressed mood: Secondary | ICD-10-CM | POA: Diagnosis not present

## 2019-05-15 NOTE — Progress Notes (Signed)
Virtual Visit via Video Note  I connected with Natarsha N Bueche on 05/15/19 at 12:00 PM EST by a video enabled telemedicine application and verified that I am speaking with the correct person using two identifiers.  Location: Patient: Alexis Mccullough Provider: Lise Auer, LCSW   I discussed the limitations of evaluation and management by telemedicine and the availability of in person appointments. The patient expressed understanding and agreed to proceed.  History of Present Illness: Grief, Bipolar I Disorder, Cannabis abuse and alcohol Abuse   Observations/Objective: Counselor met with patient in the context of Group Therapy, via Webex. Counselor prompted patient to share updates on coping skill application and individual therapeutic process in management of mental health. 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 She intends to begin Hospice Support Group and Counseling this coming month. Counselor prompted patient to share areas of concern, challenges and identify barriers to meeting current goals. Alexis Mccullough shared that she engaged in unhealthy coping skills, alcohol and cannabis use, to numb the grief and depression, in order to "function instead of feel". She acknowledged that she was aware of healthier coping strategies to use, which caused her to begin detox from alcohol and cannabis on 05/11/19. Alexis Mccullough shared that she is starting the grief process, with loss of appetite present as well. Patient engaged in discussion, provided feedback for others within the group and took note of additional strategies reviewed and discussed.   Assessment and Plan: Counselor recommends client continue following treatment plan goals, following crisis plan and following up with behavioral health and medical providers as needed. Patient is welcome to join group  at next session.   Follow Up Instructions: Counselor to provide link for next session via Webex platform.    I discussed the assessment and treatment plan with the patient. The patient was provided an opportunity to ask questions and all were answered. The patient agreed with the plan and demonstrated an understanding of the instructions.   The patient was advised to call back or seek an in-person evaluation if the symptoms worsen or if the condition fails to improve as anticipated.  I provided 60 minutes of non-face-to-face time during this encounter.   Lise Auer, LCSW

## 2019-05-22 ENCOUNTER — Other Ambulatory Visit: Payer: Self-pay

## 2019-05-22 ENCOUNTER — Ambulatory Visit (INDEPENDENT_AMBULATORY_CARE_PROVIDER_SITE_OTHER): Payer: 59 | Admitting: Psychiatry

## 2019-05-22 DIAGNOSIS — F4321 Adjustment disorder with depressed mood: Secondary | ICD-10-CM | POA: Diagnosis not present

## 2019-05-22 DIAGNOSIS — F319 Bipolar disorder, unspecified: Secondary | ICD-10-CM | POA: Diagnosis not present

## 2019-05-23 ENCOUNTER — Encounter (HOSPITAL_COMMUNITY): Payer: Self-pay | Admitting: Psychiatry

## 2019-05-23 NOTE — Progress Notes (Signed)
Virtual Visit via Video Note  I connected with [name] on 05/23/19 at 12:00 PM EST by a video enabled telemedicine application and verified that I am speaking with the correct person using two identifiers.  Location: Patient: Alexis Mccullough Provider: Lise Auer, LCSW   I discussed the limitations of evaluation and management by telemedicine and the availability of in person appointments. The patient expressed understanding and agreed to proceed.  History of Present Illness: Bipolar 1 and Grief   Observations/Objective: Counselor met with patient in the context of Group Therapy, via Webex. Counselor prompted patient to share updates on coping skill application and individual therapeutic process in management of mental health. [name] discussed a focus on exploring grief and loss feelings and allowing self to heal. Counselor prompted patient to share areas of concern, challenges and identify barriers to meeting current goals. [name] was exposed to [place] and is awaiting diagnosis, as she has many of the symptoms, which has lowered her capacity to actively work on various life domains and has attributed to decrease in daily functioning. Topics covered, filtering/engaging support system, grief and loss, impact of health on mental health, and trust issues. Patient engaged in discussion, provided feedback for others within the group and took note of additional strategies reviewed and discussed.   Assessment and Plan: Counselor recommends client continue following treatment plan goals, following crisis plan and following up with behavioral health and medical providers as needed. Patient is welcome to join group at next session.   Follow Up Instructions: Counselor to provide link for next session via Webex platform.     I discussed the assessment and treatment plan with the patient. The patient was provided an opportunity to ask questions and all were answered. The patient agreed with the plan  and demonstrated an understanding of the instructions.   The patient was advised to call back or seek an in-person evaluation if the symptoms worsen or if the condition fails to improve as anticipated.  I provided 60 minutes of non-face-to-face time during this encounter.   Lise Auer, LCSW

## 2019-05-28 ENCOUNTER — Other Ambulatory Visit: Payer: Self-pay

## 2019-05-28 ENCOUNTER — Encounter (HOSPITAL_COMMUNITY): Payer: Self-pay | Admitting: Psychiatry

## 2019-05-28 ENCOUNTER — Ambulatory Visit (INDEPENDENT_AMBULATORY_CARE_PROVIDER_SITE_OTHER): Payer: 59 | Admitting: Psychiatry

## 2019-05-28 DIAGNOSIS — F419 Anxiety disorder, unspecified: Secondary | ICD-10-CM

## 2019-05-28 DIAGNOSIS — F1210 Cannabis abuse, uncomplicated: Secondary | ICD-10-CM

## 2019-05-28 DIAGNOSIS — F3160 Bipolar disorder, current episode mixed, unspecified: Secondary | ICD-10-CM | POA: Diagnosis not present

## 2019-05-28 DIAGNOSIS — F319 Bipolar disorder, unspecified: Secondary | ICD-10-CM

## 2019-05-28 MED ORDER — HYDROXYZINE PAMOATE 25 MG PO CAPS: 25.0000 mg | ORAL_CAPSULE | Freq: Every day | ORAL | 2 refills | Status: DC | PRN

## 2019-05-28 MED ORDER — CITALOPRAM HYDROBROMIDE 20 MG PO TABS: 20.0000 mg | ORAL_TABLET | Freq: Every day | ORAL | 2 refills | Status: DC

## 2019-05-28 MED ORDER — ARIPIPRAZOLE 5 MG PO TABS: ORAL_TABLET | ORAL | 2 refills | Status: DC
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
## 2019-05-29 ENCOUNTER — Ambulatory Visit (INDEPENDENT_AMBULATORY_CARE_PROVIDER_SITE_OTHER): Payer: 59 | Admitting: Psychiatry

## 2019-05-29 ENCOUNTER — Encounter (HOSPITAL_COMMUNITY): Payer: Self-pay | Admitting: Psychiatry

## 2019-05-29 ENCOUNTER — Other Ambulatory Visit: Payer: Self-pay

## 2019-05-29 DIAGNOSIS — F319 Bipolar disorder, unspecified: Secondary | ICD-10-CM

## 2019-05-29 NOTE — Progress Notes (Signed)
Virtual Visit via Video Note  I connected with Alexis Mccullough on 05/29/19 at 12:00 PM EST by a video enabled telemedicine application and verified that I am speaking with the correct person using two identifiers.  Location: Patient: Alexis Mccullough Provider: Lise Auer, LCSW   I discussed the limitations of evaluation and management by telemedicine and the availability of in person appointments. The patient expressed understanding and agreed to proceed.  History of Present Illness: Bipolar 1 DO   Observations/Objective: Counselor met with patient in the context of Group Therapy, via Webex. Counselor prompted patient to share updates on coping skill application and individual therapeutic process in management of mental health. Alexis Mccullough reported growth in setting boundaries with others, putting her needs first, and allowing self to be happy and have fun, despite the loss of her father. Counselor prompted patient to share areas of concern, challenges and identify barriers to meeting current goals. Alexis Mccullough continues to process grief and loss symptoms. She has noticed irritability and anxiety increasing with recent efforts towards recovery. She reports having difficult moments and days, but overall success in recovery thus far. Topics covered: grief and loss, internal processing, decision making, life purpose, and boundary setting. Patient engaged in discussion, provided feedback for others within the group and took note of additional strategies reviewed and discussed.   Assessment and Plan: Counselor recommends client continue following treatment plan goals, following crisis plan and following up with behavioral health and medical providers as needed. Patient is welcome to join group at next session.   Follow Up Instructions: Counselor to provide link for next session via Webex platform.     I discussed the assessment and treatment plan with the patient. The patient was provided an opportunity to ask  questions and all were answered. The patient agreed with the plan and demonstrated an understanding of the instructions.   The patient was advised to call back or seek an in-person evaluation if the symptoms worsen or if the condition fails to improve as anticipated.  I provided 60 minutes of non-face-to-face time during this encounter.   Lise Auer, LCSW

## 2019-06-04 ENCOUNTER — Other Ambulatory Visit (HOSPITAL_COMMUNITY): Payer: Self-pay | Admitting: *Deleted

## 2019-06-04 ENCOUNTER — Telehealth (HOSPITAL_COMMUNITY): Payer: Self-pay | Admitting: *Deleted

## 2019-06-04 DIAGNOSIS — F1210 Cannabis abuse, uncomplicated: Secondary | ICD-10-CM

## 2019-06-04 DIAGNOSIS — F319 Bipolar disorder, unspecified: Secondary | ICD-10-CM

## 2019-06-04 MED ORDER — ARIPIPRAZOLE 2 MG PO TABS: ORAL_TABLET | ORAL | 0 refills | Status: DC

## 2019-06-04 NOTE — Telephone Encounter (Signed)
Writer returned pt call regarding medication, Abilify 5mg , making her feel like "a zombie". Pt c/o that the pill is too small to cut in half as she has tried with a pill splitter. Writer sent in RX for abilify 2mg  po qhs to , , [facility]. Pt verbalizes understanding. Pt instructed to call this writer to keep clinic informed as to how she is tolerating lower dose. Pt agrees she will.

## 2019-06-05 ENCOUNTER — Other Ambulatory Visit: Payer: Self-pay

## 2019-06-05 ENCOUNTER — Ambulatory Visit (INDEPENDENT_AMBULATORY_CARE_PROVIDER_SITE_OTHER): Payer: 59 | Admitting: Psychiatry

## 2019-06-05 ENCOUNTER — Encounter (HOSPITAL_COMMUNITY): Payer: Self-pay | Admitting: Psychiatry

## 2019-06-05 ENCOUNTER — Encounter (HOSPITAL_COMMUNITY): Payer: Self-pay | Admitting: *Deleted

## 2019-06-05 DIAGNOSIS — F4321 Adjustment disorder with depressed mood: Secondary | ICD-10-CM | POA: Diagnosis not present

## 2019-06-05 DIAGNOSIS — F319 Bipolar disorder, unspecified: Secondary | ICD-10-CM

## 2019-06-05 NOTE — Telephone Encounter (Signed)
Ok to return on 06/11/2019. She is trying to adjust on meds.

## 2019-06-05 NOTE — Progress Notes (Signed)
Virtual Visit via Video Note  I connected with Alexis Mccullough on 06/05/19 at 12:00 PM EST by a video enabled telemedicine application and verified that I am speaking with the correct person using two identifiers.  Location: Patient: Patient Home Provider: Home Office   History of Present Illness: Bipolar 1 Disorder and Grief  Observations/Objective: Counselor met with Patient in the context of Group Therapy, via Webex. Counselor prompted Patient to share updates on coping skill application and individual therapeutic process in management of mental health. Alexis Mccullough reported being 26 days sober and complying with medication recommendations from provider. She discussed the benefits from being more connected with her self absent from substance use/dependency. Counselor prompted Patient to share areas of concern, challenges and identify barriers to meeting current goals. Alexis Mccullough discussed challenges with medications due to co-pays and getting in a routine. She shared negative side effects. Alexis Mccullough plans to contact provider after session to discuss concerns. Alexis Mccullough wants to focus on eliminating excuses and having a successful transition back to work next week.  Topics covered: mental health stigma, taboos in disclosure of personal issues, self-care and self-advocacy in the workplace. Patient engaged in discussion, provided feedback for others within the group and took note of additional strategies reviewed and discussed.   Assessment and Plan: Counselor recommends client continue following treatment plan goals, following crisis plan and following up with behavioral health and medical providers as needed. Patient is welcome to join group at next session.   Follow Up Instructions: Counselor to provide link for next session via Webex platform.

## 2019-06-05 NOTE — Telephone Encounter (Signed)
Patient called requesting extension on her FMLA. She would like to return to work Monday 2/1 instead of tomorrow 1/27. If doctor agrees, patient is under the understanding that I can call Loletta Parish and give a verbal ok. Please advise. Thank you.

## 2019-06-12 ENCOUNTER — Ambulatory Visit (INDEPENDENT_AMBULATORY_CARE_PROVIDER_SITE_OTHER): Payer: 59 | Admitting: Psychiatry

## 2019-06-12 ENCOUNTER — Encounter (HOSPITAL_COMMUNITY): Payer: Self-pay | Admitting: Psychiatry

## 2019-06-12 DIAGNOSIS — F4321 Adjustment disorder with depressed mood: Secondary | ICD-10-CM | POA: Diagnosis not present

## 2019-06-12 DIAGNOSIS — F319 Bipolar disorder, unspecified: Secondary | ICD-10-CM

## 2019-06-12 NOTE — Progress Notes (Signed)
Virtual Visit via Video Note  I connected with Alexis Mccullough on 06/12/19 at 12:00 PM EST by a video enabled telemedicine application and verified that I am speaking with the correct person using two identifiers.  Location: Patient: Patient Home Provider: Home Office   History of Present Illness: Bipolar 1 DO and Grief  Observations/Objective: Counselor met with Patient in the context of Group Therapy, via Webex. Counselor prompted Patient to share updates on coping skill application and individual therapeutic process in management of mental health. Alexis Mccullough reports having a break through in her grieving process, allowing herself to weep and express emotions with a healthy outlet. Alexis Mccullough is getting reconnected with her faith, talents, interests and hobbies. She reported being optimistic about her return to work, despite the technical challenges with a smooth return. Counselor prompted Patient to share areas of concern, challenges and identify barriers to meeting current goals. Alexis Mccullough processed a work situation with the group for feedback on how to communicated more effectively, set boundaries and maintain professionalism. Topics covered: structuring days, sleep hygiene/routine, dealing with disappointments, permission to feel/express hard emotions and volunteering as a healthy outlet/use of time. Patient engaged in discussion, provided feedback for others within the group and took note of additional strategies reviewed and discussed.   Assessment and Plan: Counselor recommends client continue following treatment plan goals, following crisis plan and following up with behavioral health and medical providers as needed. Patient is welcome to join group at next session.   Follow Up Instructions: Counselor to provide link for next session via Webex platform. The patient was advised to call back or seek an in-person evaluation if the symptoms worsen or if the condition fails to improve as anticipated.  I  provided 60 minutes of non-face-to-face time during this encounter.   Lise Auer, LCSW

## 2019-06-26 ENCOUNTER — Encounter (HOSPITAL_COMMUNITY): Payer: Self-pay | Admitting: Psychiatry

## 2019-06-26 ENCOUNTER — Ambulatory Visit (INDEPENDENT_AMBULATORY_CARE_PROVIDER_SITE_OTHER): Payer: 59 | Admitting: Psychiatry

## 2019-06-26 ENCOUNTER — Other Ambulatory Visit: Payer: Self-pay

## 2019-06-26 DIAGNOSIS — F319 Bipolar disorder, unspecified: Secondary | ICD-10-CM | POA: Diagnosis not present

## 2019-06-26 NOTE — Progress Notes (Signed)
Virtual Visit via Video Note  I connected with Alexis Mccullough on 06/26/19 at 12:00 PM EST by a video enabled telemedicine application and verified that I am speaking with the correct person using two identifiers.  Location: Patient: Patient Home Provider: Home Office   History of Present Illness: Bipolar 1 DO  Observations/Objective: Counselor met with Patient in the context of Group Therapy, via Webex. Counselor prompted Patient to share updates on coping skill application and individual therapeutic process in management of mental health. Alexis Mccullough reported on being more intentional about keeping therapy and medical appointments. She notes improvements in communication and boundary setting in in the workplace, which is helping to meet mental health needs as well. Counselor prompted Patient to share areas of concern, challenges and identify barriers to meeting current goals. Alexis Mccullough opened up about challenges and emotions connected with a potential intimate partnership, discussing grief and loss issues, emotional baggage and individual needs for growth. Topics covered: workplace success/dynamics, navigating feelings from failed relationships, self-worth and communicating needs with others. Patient engaged in discussion, provided feedback for others within the group and took note of additional strategies reviewed and discussed. Alexis Mccullough presents with moderate anxiety and mild depression.  Assessment and Plan: Counselor recommends client continue following treatment plan goals, following crisis plan and following up with behavioral health and medical providers as needed. Patient is welcome to join group at next session.   Follow Up Instructions: Counselor to provide link for next session via Webex platform. The patient was advised to call back or seek an in-person evaluation if the symptoms worsen or if the condition fails to improve as anticipated.  I provided 60 minutes of non-face-to-face time during  this encounter.   Lise Auer, LCSW

## 2019-07-03 ENCOUNTER — Encounter (HOSPITAL_COMMUNITY): Payer: Self-pay | Admitting: Psychiatry

## 2019-07-03 ENCOUNTER — Other Ambulatory Visit: Payer: Self-pay

## 2019-07-03 ENCOUNTER — Ambulatory Visit (INDEPENDENT_AMBULATORY_CARE_PROVIDER_SITE_OTHER): Payer: 59 | Admitting: Psychiatry

## 2019-07-03 DIAGNOSIS — F319 Bipolar disorder, unspecified: Secondary | ICD-10-CM | POA: Diagnosis not present

## 2019-07-03 DIAGNOSIS — F4321 Adjustment disorder with depressed mood: Secondary | ICD-10-CM

## 2019-07-03 NOTE — Progress Notes (Signed)
Virtual Visit via Video Note  I connected with Alexis Mccullough on 07/03/19 at 12:00 PM EST by a video enabled telemedicine application and verified that I am speaking with the correct person using two identifiers.  Location: Patient: Patient Home Provider: Home Office   History of Present Illness: Bipolar 1 DO and Grief  Observations/Objective: Counselor met with Patient in the context of Group Therapy, via Webex. Counselor prompted Patient to share updates on coping skill application and individual therapeutic process in management of mental health. Alexis Mccullough notes that she is maintaining mental wellness by attending doctors appointments, therapy appointments, prioritizing her needs, and setting healthier boundaries with others. Counselor prompted Patient to share areas of concern, challenges and identify barriers to meeting current goals. Alexis Mccullough discussed ongoing grief and loss reminders and how she is attempting to cope in health ways. Alexis Mccullough is working through anxiety around a new physical health diagnosis, awaiting more information and tests. Topics covered: handling difficult news, optimistic actions, communicating needs, setting boundaries, positive self-talk, and self-advocacy. Patient engaged in discussion, provided feedback for others within the group and took note of additional strategies reviewed and discussed.   Assessment and Plan: Counselor recommends client continue following treatment plan goals, following crisis plan and following up with behavioral health and medical providers as needed. Patient is welcome to join group at next session.   Follow Up Instructions: Counselor to provide link for next session via Webex platform. The patient was advised to call back or seek an in-person evaluation if the symptoms worsen or if the condition fails to improve as anticipated.  I provided 60 minutes of non-face-to-face time during this encounter.   Lise Auer, LCSW

## 2019-07-25 NOTE — Telephone Encounter (Signed)
Error

## 2019-08-27 ENCOUNTER — Encounter (HOSPITAL_COMMUNITY): Payer: Self-pay | Admitting: Psychiatry

## 2019-08-27 ENCOUNTER — Ambulatory Visit (INDEPENDENT_AMBULATORY_CARE_PROVIDER_SITE_OTHER): Payer: 59 | Admitting: Psychiatry

## 2019-08-27 ENCOUNTER — Other Ambulatory Visit: Payer: Self-pay

## 2019-08-27 ENCOUNTER — Other Ambulatory Visit (HOSPITAL_COMMUNITY): Payer: Self-pay | Admitting: Psychiatry

## 2019-08-27 VITALS — Wt 336.0 lb

## 2019-08-27 DIAGNOSIS — F319 Bipolar disorder, unspecified: Secondary | ICD-10-CM | POA: Diagnosis not present

## 2019-08-27 DIAGNOSIS — F419 Anxiety disorder, unspecified: Secondary | ICD-10-CM

## 2019-08-27 MED ORDER — HYDROXYZINE PAMOATE 25 MG PO CAPS: 25.0000 mg | ORAL_CAPSULE | Freq: Every day | ORAL | 0 refills | Status: DC | PRN

## 2019-08-27 MED ORDER — CITALOPRAM HYDROBROMIDE 20 MG PO TABS: 20.0000 mg | ORAL_TABLET | Freq: Every day | ORAL | 2 refills | Status: DC
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
## 2019-10-30 ENCOUNTER — Other Ambulatory Visit: Payer: Self-pay

## 2019-10-30 ENCOUNTER — Ambulatory Visit (INDEPENDENT_AMBULATORY_CARE_PROVIDER_SITE_OTHER): Payer: 59 | Admitting: Psychiatry

## 2019-10-30 ENCOUNTER — Encounter (HOSPITAL_COMMUNITY): Payer: Self-pay | Admitting: Psychiatry

## 2019-10-30 DIAGNOSIS — F319 Bipolar disorder, unspecified: Secondary | ICD-10-CM

## 2019-10-30 NOTE — Progress Notes (Signed)
Virtual Visit via Video Note  I connected with Wren N Newhouse on 10/30/19 at 12:00 PM EDT by a video enabled telemedicine application and verified that I am speaking with the correct person using two identifiers.  Location: Patient: Patient Home Provider: Home Office   History of Present Illness: Bipolar 1 DO  Observations/Objective: Counselor met with Patient in the context of Group Therapy, via Webex. Counselor prompted Patient to share updates on coping skill application and individual therapeutic process in management of mental health. Patient reports feeling stable and healthy overall, compared to discharge from group. Continuing to do grief and loss work and finding activities that are pleasurable to start enjoying life again. Counselor prompted Patient to share areas of concern, challenges and identify barriers to meeting current goals. No issues to report. Topics covered: stage of life issues, self-doubt, self-awareness, communicating needs, setting boundaries, positive self-talk, and self-advocacy. Patient engaged in discussion, provided feedback for others within the group and took note of additional strategies reviewed and discussed.   Assessment and Plan: Counselor recommends client continue following treatment plan goals, following crisis plan and following up with behavioral health and medical providers as needed. Patient is welcome to join group at next session.   Follow Up Instructions: Counselor to provide link for next session via Webex platform. The patient was advised to call back or seek an in-person evaluation if the symptoms worsen or if the condition fails to improve as anticipated.  I provided 60 minutes of non-face-to-face time during this encounter.   Lise Auer, LCSW

## 2019-11-06 ENCOUNTER — Ambulatory Visit (HOSPITAL_COMMUNITY): Payer: 59 | Admitting: Psychiatry

## 2019-11-27 ENCOUNTER — Ambulatory Visit (INDEPENDENT_AMBULATORY_CARE_PROVIDER_SITE_OTHER): Payer: 59 | Admitting: Psychiatry

## 2019-11-27 ENCOUNTER — Encounter (HOSPITAL_COMMUNITY): Payer: Self-pay | Admitting: Psychiatry

## 2019-11-27 ENCOUNTER — Telehealth (HOSPITAL_COMMUNITY): Payer: Self-pay | Admitting: Psychiatry

## 2019-11-27 DIAGNOSIS — F319 Bipolar disorder, unspecified: Secondary | ICD-10-CM | POA: Diagnosis not present

## 2019-11-27 NOTE — Telephone Encounter (Signed)
D:  Patient phoned interested in CD-IOP until she found out it was in person.  According to pt, she is currently in [place].  A:  Will discuss options in [place] or local virtual options with Fulton Reek, LCSW, LCAS.  R:  Patient receptive.

## 2019-11-27 NOTE — Progress Notes (Signed)
Virtual Visit via Video Note  I connected with Alexis Mccullough on 11/27/19 at 12:00 PM EDT by a video enabled telemedicine application and verified that I am speaking with the correct person using two identifiers. Location: Alexis Mccullough: Alexis Mccullough Home Provider: Home Office   History of Present Illness: Bipolar 1 DO   Observations/Objective: Counselor met with Alexis Mccullough in the context of Group Therapy, via Webex. Counselor prompted Alexis Mccullough to share updates on coping skill application and individual therapeutic process in management of mental health. Client noted starting new medication regimen for stabilization. Client noted finding new setting/environment as helpful for mental health. Counselor prompted Alexis Mccullough to share areas of concern, challenges and identify barriers to meeting current goals. Client noted new medications are causing her to sleep more and be drowsy. Client noted ongoing challenges with substance use. Topics covered: stage of life issues, self-doubt, self-awareness, communicating needs, setting boundaries, positive self-talk, and self-advocacy. Alexis Mccullough engaged in discussion, provided feedback for others within the group and took note of additional strategies reviewed and discussed.   Assessment and Plan: Counselor recommends client continue following treatment plan goals, following crisis plan and following up with behavioral health and medical providers as needed. Alexis Mccullough is welcome to join group at next session.   Follow Up Instructions: Counselor to provide link for next session via Webex platform. The Alexis Mccullough was advised to call back or seek an in-person evaluation if the symptoms worsen or if the condition fails to improve as anticipated.  I provided 60 minutes of non-face-to-face time during this encounter.   Lise Auer, LCSW

## 2019-11-29 ENCOUNTER — Ambulatory Visit (HOSPITAL_COMMUNITY): Payer: 59 | Admitting: Licensed Clinical Social Worker

## 2019-11-30 ENCOUNTER — Telehealth (INDEPENDENT_AMBULATORY_CARE_PROVIDER_SITE_OTHER): Payer: 59 | Admitting: Psychiatry

## 2019-11-30 ENCOUNTER — Other Ambulatory Visit: Payer: Self-pay

## 2019-11-30 ENCOUNTER — Encounter (HOSPITAL_COMMUNITY): Payer: Self-pay | Admitting: Psychiatry

## 2019-11-30 VITALS — Wt 346.0 lb

## 2019-11-30 DIAGNOSIS — F1010 Alcohol abuse, uncomplicated: Secondary | ICD-10-CM

## 2019-11-30 DIAGNOSIS — F1210 Cannabis abuse, uncomplicated: Secondary | ICD-10-CM | POA: Diagnosis not present

## 2019-11-30 DIAGNOSIS — F319 Bipolar disorder, unspecified: Secondary | ICD-10-CM

## 2019-11-30 DIAGNOSIS — F419 Anxiety disorder, unspecified: Secondary | ICD-10-CM

## 2019-11-30 MED ORDER — CITALOPRAM HYDROBROMIDE 20 MG PO TABS: 20.0000 mg | ORAL_TABLET | Freq: Every day | ORAL | 1 refills | Status: DC

## 2019-11-30 MED ORDER — HYDROXYZINE PAMOATE 25 MG PO CAPS: 25.0000 mg | ORAL_CAPSULE | Freq: Every day | ORAL | 1 refills | Status: DC | PRN

## 2019-11-30 MED ORDER — ARIPIPRAZOLE 2 MG PO TABS: ORAL_TABLET | ORAL | 1 refills | Status: DC
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
## 2019-12-04 ENCOUNTER — Encounter (HOSPITAL_COMMUNITY): Payer: Self-pay | Admitting: Psychiatry

## 2019-12-04 ENCOUNTER — Ambulatory Visit (INDEPENDENT_AMBULATORY_CARE_PROVIDER_SITE_OTHER): Payer: 59 | Admitting: Psychiatry

## 2019-12-04 ENCOUNTER — Other Ambulatory Visit: Payer: Self-pay

## 2019-12-04 DIAGNOSIS — F319 Bipolar disorder, unspecified: Secondary | ICD-10-CM

## 2019-12-04 NOTE — Progress Notes (Signed)
Virtual Visit via Video Note  I connected with Alexis Mccullough on 12/04/19 at 12:00 PM EDT by a video enabled telemedicine application and verified that I am speaking with the correct person using two identifiers.  Location: Location: Patient: Patient Home Provider: Home Office   History of Present Illness: Bipolar 1 DO  Observations/Objective: Counselor met with Patient in the context of Group Therapy, via Webex. Counselor prompted Patient to share updates on coping skill application and individual therapeutic process in management of mental health. Client reports that she will be attending her first AA/NA meeting in a new city today. Client discussed a desire to stay focused on the message and not on getting connected with potentially unhealthy people. She noted that she is finding ways to express herself creatively and is getting connected with important people in her line of work, which is causing her to be more hopeful about her future.  Counselor prompted Patient to share areas of concern, challenges and identify barriers to meeting current goals. Client noted that she and a house mate has a misunderstanding and confrontation. She was happy with how she handled herself and that they were able to get it resolved. Topics covered: finding purpose, sitting with emotions in a healthy way, redefining self, overall wellness, recovery and grief and loss issues. Patient engaged in discussion, provided feedback for others within the group and took note of additional strategies reviewed and discussed.   Assessment and Plan: Counselor recommends client continue following treatment plan goals, following crisis plan and following up with behavioral health and medical providers as needed. Patient is welcome to join group at next session.   Follow Up Instructions: Counselor to provide link for next session via Webex platform. The patient was advised to call back or seek an in-person evaluation if the  symptoms worsen or if the condition fails to improve as anticipated.  I provided 75 minutes of non-face-to-face time during this encounter.   Lise Auer, LCSW

## 2019-12-11 ENCOUNTER — Encounter (HOSPITAL_COMMUNITY): Payer: Self-pay | Admitting: Psychiatry

## 2019-12-11 ENCOUNTER — Ambulatory Visit (HOSPITAL_COMMUNITY): Payer: 59 | Admitting: Psychiatry

## 2019-12-11 DIAGNOSIS — F319 Bipolar disorder, unspecified: Secondary | ICD-10-CM

## 2019-12-11 NOTE — Progress Notes (Signed)
Client joined group but within minutes was triggered by another group member, causing her to have to log of to self-regulate. Client communicated concern with Counselor and plans to attend upcoming groups.  Hilbert Odor, LCSW

## 2019-12-25 ENCOUNTER — Ambulatory Visit (INDEPENDENT_AMBULATORY_CARE_PROVIDER_SITE_OTHER): Payer: 59 | Admitting: Psychiatry

## 2019-12-25 ENCOUNTER — Encounter (HOSPITAL_COMMUNITY): Payer: Self-pay | Admitting: Psychiatry

## 2019-12-25 DIAGNOSIS — F319 Bipolar disorder, unspecified: Secondary | ICD-10-CM | POA: Diagnosis not present

## 2019-12-25 NOTE — Progress Notes (Signed)
Virtual Visit via Video Note  I connected with Alexis Mccullough on 12/25/19 at 12:00 PM EDT by a video enabled telemedicine application and verified that I am speaking with the correct person using two identifiers.  GROUP GOAL: Client will attend IOP Aftercare Group Therapy 2-4x a month to connect with peers and to apply strategies discussed to improve mental health condition.   Location: Patient: Patient Home Provider: Home Office   History of Present Illness: Bipolar 1 DO  Observations/Objective: Counselor met with Patient in the context of Group Therapy, via Webex. Counselor prompted Patient to share updates on coping skill application and individual therapeutic process in management of mental health. Client shared that she remains sober, has started working out and has officially made the decision to move to a new city to start fresh. Client shared about the positive impacts on her relationships with her siblings. She expressed feeling more free and confident to be who she is without having to water herself down, causing less fear. Counselor prompted Patient to share areas of concern, challenges and identify barriers to meeting current goals. Client noted the need to set good boundaries within the dating context and remaining to focus on self. She is doing work around her capacity to care for self and others. Topics covered: overall wellness, impacts of mental health on physical health, improved relationships, acceptance and trauma issues. Patient engaged in discussion, provided feedback for others within the group and took note of additional strategies reviewed and discussed.   Assessment and Plan: Counselor recommends client continue following treatment plan goals, following crisis plan and following up with behavioral health and medical providers as needed. Patient is welcome to join group at next session.   Follow Up Instructions: Counselor to provide link for next session via Webex  platform. The patient was advised to call back or seek an in-person evaluation if the symptoms worsen or if the condition fails to improve as anticipated.  I provided 65 minutes of non-face-to-face time during this encounter.   Lise Auer, LCSW

## 2020-01-01 ENCOUNTER — Encounter (HOSPITAL_COMMUNITY): Payer: Self-pay | Admitting: Psychiatry

## 2020-01-01 ENCOUNTER — Ambulatory Visit (INDEPENDENT_AMBULATORY_CARE_PROVIDER_SITE_OTHER): Payer: 59 | Admitting: Psychiatry

## 2020-01-01 DIAGNOSIS — F319 Bipolar disorder, unspecified: Secondary | ICD-10-CM | POA: Diagnosis not present

## 2020-01-01 NOTE — Progress Notes (Signed)
Virtual Visit via Video Note  I connected with Alexis Mccullough on 01/01/20 at 12:00 PM EDT by a video enabled telemedicine application and verified that I am speaking with the correct person using two identifiers.  GROUP GOAL: Client will attend IOP Aftercare Group Therapy 2-4x a month to connect with peers and to apply strategies discussed to improve mental health condition.   Location: Patient: Patient Home Provider: Home Office   History of Present Illness: Bipolar 1 DO  Observations/Objective: Counselor met with Patient in the context of Group Therapy, via Webex. Counselor prompted Patient to share updates on coping skill application and individual therapeutic process in management of mental health. Client stated that she started back to work and that it is bringing her fulfillment and a sense of normalcy. Client discussed ongoing sobriety, getting connected with support groups and making positive choices in relationships. Counselor prompted Patient to share areas of concern, challenges and identify barriers to meeting current goals. Client discussed an upcoming legal matter she is anxious about and stated that she is not feeling well physically today. She also noted that she has been reflecting on her move and is processing how things became out of hand internally, familial and relationally. Topics covered: honest, communication skills and the skill of radical acceptance. Patient engaged in discussion, provided feedback for others within the group and took note of additional strategies reviewed and discussed.   Assessment and Plan: Counselor recommends client continue following treatment plan goals, following crisis plan and following up with behavioral health and medical providers as needed. Patient is welcome to join group at next session.   Follow Up Instructions: Counselor to provide link for next session via Webex platform. The patient was advised to call back or seek an in-person  evaluation if the symptoms worsen or if the condition fails to improve as anticipated.  I provided 70 minutes of non-face-to-face time during this encounter.   Lise Auer, LCSW

## 2020-01-15 ENCOUNTER — Telehealth (INDEPENDENT_AMBULATORY_CARE_PROVIDER_SITE_OTHER): Payer: 59 | Admitting: Psychiatry

## 2020-01-15 ENCOUNTER — Other Ambulatory Visit: Payer: Self-pay

## 2020-01-15 ENCOUNTER — Encounter (HOSPITAL_COMMUNITY): Payer: Self-pay | Admitting: Psychiatry

## 2020-01-15 DIAGNOSIS — F319 Bipolar disorder, unspecified: Secondary | ICD-10-CM | POA: Diagnosis not present

## 2020-01-15 DIAGNOSIS — F419 Anxiety disorder, unspecified: Secondary | ICD-10-CM

## 2020-01-15 DIAGNOSIS — F1210 Cannabis abuse, uncomplicated: Secondary | ICD-10-CM

## 2020-01-15 MED ORDER — CITALOPRAM HYDROBROMIDE 20 MG PO TABS: 20.0000 mg | ORAL_TABLET | Freq: Every day | ORAL | 1 refills | Status: DC

## 2020-01-15 MED ORDER — HYDROXYZINE PAMOATE 25 MG PO CAPS: 25.0000 mg | ORAL_CAPSULE | Freq: Every day | ORAL | 1 refills | Status: DC | PRN

## 2020-01-15 MED ORDER — ARIPIPRAZOLE 2 MG PO TABS: ORAL_TABLET | ORAL | 1 refills | Status: DC
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
## 2020-03-19 ENCOUNTER — Other Ambulatory Visit: Payer: Self-pay

## 2020-03-19 ENCOUNTER — Encounter (HOSPITAL_COMMUNITY): Payer: Self-pay | Admitting: Psychiatry

## 2020-03-19 ENCOUNTER — Telehealth (INDEPENDENT_AMBULATORY_CARE_PROVIDER_SITE_OTHER): Payer: 59 | Admitting: Psychiatry

## 2020-03-19 DIAGNOSIS — F319 Bipolar disorder, unspecified: Secondary | ICD-10-CM | POA: Diagnosis not present

## 2020-03-19 DIAGNOSIS — F419 Anxiety disorder, unspecified: Secondary | ICD-10-CM

## 2020-03-19 MED ORDER — CITALOPRAM HYDROBROMIDE 20 MG PO TABS: 20.0000 mg | ORAL_TABLET | Freq: Every day | ORAL | 2 refills | Status: AC

## 2020-03-19 MED ORDER — HYDROXYZINE PAMOATE 25 MG PO CAPS: 25.0000 mg | ORAL_CAPSULE | Freq: Every day | ORAL | 1 refills | Status: AC | PRN

## 2020-03-19 MED ORDER — ARIPIPRAZOLE 2 MG PO TABS: ORAL_TABLET | ORAL | 2 refills | Status: AC
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
